# Patient Record
Sex: Male | Born: 1935
Health system: Southern US, Community
[De-identification: ages and names within clinical notes are randomized; demographics above are authoritative.]

## PROBLEM LIST (undated history)

## (undated) DIAGNOSIS — N189 Chronic kidney disease, unspecified: Secondary | ICD-10-CM

## (undated) DIAGNOSIS — M1990 Unspecified osteoarthritis, unspecified site: Secondary | ICD-10-CM

## (undated) DIAGNOSIS — E785 Hyperlipidemia, unspecified: Secondary | ICD-10-CM

## (undated) DIAGNOSIS — R0600 Dyspnea, unspecified: Secondary | ICD-10-CM

## (undated) DIAGNOSIS — K589 Irritable bowel syndrome without diarrhea: Secondary | ICD-10-CM

## (undated) DIAGNOSIS — K219 Gastro-esophageal reflux disease without esophagitis: Secondary | ICD-10-CM

## (undated) DIAGNOSIS — C4490 Unspecified malignant neoplasm of skin, unspecified: Secondary | ICD-10-CM

## (undated) DIAGNOSIS — J449 Chronic obstructive pulmonary disease, unspecified: Secondary | ICD-10-CM

## (undated) DIAGNOSIS — I10 Essential (primary) hypertension: Secondary | ICD-10-CM

## (undated) HISTORY — PX: CHOLECYSTECTOMY: SHX55

## (undated) HISTORY — DX: Irritable bowel syndrome, unspecified: K58.9

## (undated) HISTORY — PX: SKIN CANCER EXCISION: SHX779

## (undated) HISTORY — PX: HERNIA REPAIR: SHX51

## (undated) HISTORY — PX: APPENDECTOMY: SHX54

## (undated) HISTORY — DX: Essential (primary) hypertension: I10

## (undated) HISTORY — DX: Hyperlipidemia, unspecified: E78.5

---

## 1999-10-02 ENCOUNTER — Encounter (INDEPENDENT_AMBULATORY_CARE_PROVIDER_SITE_OTHER): Payer: Self-pay | Admitting: Specialist

## 1999-10-02 ENCOUNTER — Ambulatory Visit (HOSPITAL_COMMUNITY): Admission: RE | Admit: 1999-10-02 | Discharge: 1999-10-02 | Payer: Self-pay | Admitting: Gastroenterology

## 2005-09-20 ENCOUNTER — Encounter: Admission: RE | Admit: 2005-09-20 | Discharge: 2005-09-20 | Payer: Self-pay | Admitting: Urology

## 2005-09-26 ENCOUNTER — Encounter (INDEPENDENT_AMBULATORY_CARE_PROVIDER_SITE_OTHER): Payer: Self-pay | Admitting: *Deleted

## 2005-09-26 ENCOUNTER — Ambulatory Visit (HOSPITAL_BASED_OUTPATIENT_CLINIC_OR_DEPARTMENT_OTHER): Admission: RE | Admit: 2005-09-26 | Discharge: 2005-09-26 | Payer: Self-pay | Admitting: Urology

## 2010-05-19 ENCOUNTER — Encounter
Admission: RE | Admit: 2010-05-19 | Discharge: 2010-05-19 | Payer: Self-pay | Source: Home / Self Care | Attending: Family Medicine | Admitting: Family Medicine

## 2010-09-15 NOTE — Op Note (Signed)
NAME:  Gordon Soto, Gordon Soto NO.:  [id number]   MEDICAL RECORD NO.:  [id number]          PATIENT TYPE:  AMB   LOCATION:  [facility]                         FACILITY:  [facility]   PHYSICIAN:  Valetta Fuller, M.D.  DATE OF BIRTH:  26-Feb-1929   DATE OF PROCEDURE:  09/26/2005  DATE OF DISCHARGE:                                 OPERATIVE REPORT   PREOPERATIVE DIAGNOSIS:  Left spermatocele.   POSTOPERATIVE DIAGNOSIS:  Left cystic degeneration of epididymis.   PROCEDURE:  Left epididymectomy.   SURGEON:  Valetta Fuller, M.D.   ASSISTANT:  Terie Purser, M.D.   ANESTHESIA:  General endotracheal.   COMPLICATIONS:  None.   ESTIMATED BLOOD LOSS:  Minimal.   SPECIMENS:  Epididymis, left mass.   DISPOSITION:  Stable to postanesthesia care unit.   INDICATIONS FOR PROCEDURE:  Mr. Bissonette is a 77 year old gentleman who has  had persistent symptomatic left-sided scrotal mass.  This has slowly been  enlarging over the years.  It is a spermatocele.  The patient desired  surgical intervention for excision of the spermatocele.  He is consented for  the procedure after full explanation of benefits and risks.   DESCRIPTION OF PROCEDURE:  The patient was brought to the operating room and  properly identified.  Administered general anesthesia and given preoperative  antibiotics, placed in the supine position on the operating table, prepped  and draped in the usual sterile fashion.  Time out was performed to confirm  correct patient, procedure and side.  We then made a midline scrotal  incision through the raphae.  Dissection was carried down through the dartos  to expose the left testicle.  There was a large cystic appearing mass which  appeared to incorporate all the epididymis.  This was deemed to be a cystic  degeneration of the epididymis and not a spermatocele.  We then began to  carefully dissect the epididymis from the testicle.  Care was taken to avoid  injury to the testicle or the  cord structures adjacent to this.  Using a  combination of careful blunt dissection as well as Bovie electrocautery, we  were able to completely excise the mass from the testicle.  This was then  sent to pathology.  Hemostasis was obtained using the Bovie electrocautery,  was excellent after the mass was excised.  We then replaced the testicle in  the scrotum and the overlying dartos was closed in a running fashion using a  3-0 Vicryl suture.  The skin was then closed in a running fashion using a 4-  0 Vicryl.  Bacitracin and a sterile dressing were applied to the incision.  The patient was then awakened from anesthesia and transported to the  recovery room in stable condition.  There were no complications.   Please note that Dr. Isabel Caprice was present and participated in all aspects of  this procedure.  He was the primary Pensions consultant.     ______________________________  Terie Purser, MD      Valetta Fuller, M.D.  Electronically Signed    JH/MEDQ  D:  09/26/2005  T:  09/26/2005  Job:  [id number]

## 2010-09-15 NOTE — Procedures (Signed)
[facility]  Patient:    Gordon Soto, Gordon Soto                      MRN: [id number] Proc. Date: 10/02/99 Adm. Date:  [id number] Attending:  Nelda Marseille CC:         Petra Kuba, M.D.             Kizzie Furnish, M.D.                           Procedure Report  PROCEDURE:  Colonoscopy with biopsy.  INDICATIONS:  The patient with a family history of colon polyps due to colonic screening.  Some mild GI symptoms.  Consent was signed after risks, benefits, methods,, and options were thoroughly discussed multiple times in the past by Mr. Burstein and his wife.  MEDICINES USED:  Demerol 75, Versed 7.5.  PROCEDURE:  Rectal inspection is pertinent for external hemorrhoids.  Digital exam is negative.  Video colonoscope was inserted and there was some difficulty probably due to looping and adhesions from his previous surgery. We were finally able to advance to the cecum.  This did require rolling him on his back, on his right side and then back on left side.  Multiple abdominal pressures were obtained.  On insertion left-sided diverticula were seen but no other abnormalities.  The cecum was identified by the appendiceal orifice and the ileocecal valve.  The prep was fairly good.  He did have lots of liquid stool that required frequent washing and suctioning.  On slow withdrawal through the cecum the cecum was normal.  In the ascending colon a tiny 1 mm polyp was seen and was cold biopsied x 1.  No other polypoid lesions were seen as we slowly withdrew back to the rectum.  He did have significant left-sided diverticula.  Lots of washing and suctioning was done but no other abnormalities were seen.  Once back in the rectum the scope was retroflexed pertinent for some internal hemorrhoids with some tears probably due to scope trauma.  Scope was straightened, readvanced a short ways up the sigmoid, air was suctioned, scope removed.  The patient tolerated the  procedure adequately. There was no obvious or immediate complication.  ENDOSCOPIC DIAGNOSIS: 1. Internal and external hemorrhoids with tears probably due to scope trauma. 2. Significant left-sided diverticula. 3. Tiny ascending polyps status post cold biopsy. 4. ______ within normal limits to the cecum.  PLAN:  Yearly, rectals and guaiacs per Dr. Fayrene Fearing.  Would probably proceed with an upper GI, small bowel follow through if GI symptoms continue just to make sure no other abnormality.  Have to see back p.r.n. otherwise return to care of Dr. Fayrene Fearing and await pathology to determine repeat colonic screening. DD:  10/02/99 TD:  10/04/99 Job: [id number]

## 2011-08-20 ENCOUNTER — Encounter: Payer: Self-pay | Admitting: Vascular Surgery

## 2011-08-21 ENCOUNTER — Encounter: Payer: Self-pay | Admitting: Vascular Surgery

## 2011-08-21 ENCOUNTER — Ambulatory Visit (INDEPENDENT_AMBULATORY_CARE_PROVIDER_SITE_OTHER): Payer: Medicare Other | Admitting: Vascular Surgery

## 2011-08-21 ENCOUNTER — Ambulatory Visit (INDEPENDENT_AMBULATORY_CARE_PROVIDER_SITE_OTHER): Payer: Medicare Other | Admitting: *Deleted

## 2011-08-21 VITALS — BP 169/89 | HR 72 | Resp 20 | Ht 70.0 in | Wt 190.0 lb

## 2011-08-21 DIAGNOSIS — M25539 Pain in unspecified wrist: Secondary | ICD-10-CM

## 2011-08-21 DIAGNOSIS — M79609 Pain in unspecified limb: Secondary | ICD-10-CM

## 2011-08-21 NOTE — Progress Notes (Signed)
Subjective:     Patient ID: Gordon Soto, male   DOB: April 27, 1929, 79 y.o.   MRN: [id number]  HPI this 78 year old male patient was referred for a possible varicose vein in the left upper extremity which is painful. He has no history of DVT, thrombophlebitis, or blood clots in any extremities. He has noticed a painful area on the lateral aspect of his left forearm for the last 10 years or so which has not changed in size. There's been no history of ulceration or bleeding. In occasionally will be tender if someone bumps it but generally it is asymptomatic. He has no similar lesions in any other areas.  Past Medical History  Diagnosis Date  . Hypertension   . Hyperlipidemia   . Diabetes mellitus   . Irritable bowel syndrome   . Gastric ulcer     History  Substance Use Topics  . Smoking status: Former Smoker -- 15 years    Types: Cigarettes    Quit date: 08/21/1963  . Smokeless tobacco: Former Neurosurgeon    Quit date: 08/21/1963  . Alcohol Use: No    Family History  Problem Relation Age of Onset  . Diabetes Father     No Known Allergies  Current outpatient prescriptions:dexlansoprazole (DEXILANT) 60 MG capsule, Take 60 mg by mouth daily., Disp: , Rfl: ;  diazepam (VALIUM) 5 MG tablet, Take 5 mg by mouth as needed., Disp: , Rfl: ;  Dutasteride-Tamsulosin HCl (JALYN) 0.5-0.4 MG CAPS, Take 0.5 mg by mouth daily., Disp: , Rfl: ;  glipiZIDE (GLUCOTROL XL) 10 MG 24 hr tablet, Take 10 mg by mouth daily., Disp: , Rfl:  hyoscyamine (LEVBID) 0.375 MG 12 hr tablet, Take 0.375 mg by mouth every 12 (twelve) hours as needed., Disp: , Rfl: ;  lisinopril (PRINIVIL,ZESTRIL) 40 MG tablet, Take 40 mg by mouth daily., Disp: , Rfl: ;  metformin (FORTAMET) 500 MG (OSM) 24 hr tablet, Take 500 mg by mouth daily with breakfast., Disp: , Rfl: ;  metoprolol succinate (TOPROL-XL) 50 MG 24 hr tablet, Take 50 mg by mouth daily. Take with or immediately following a meal., Disp: , Rfl:  simvastatin (ZOCOR) 20 MG tablet,  Take 20 mg by mouth every evening., Disp: , Rfl: ;  VENTOLIN HFA 108 (90 BASE) MCG/ACT inhaler, , Disp: , Rfl:   BP 169/89  Pulse 72  Resp 20  Ht 5\' 10"  (1.778 m)  Wt 190 lb (86.183 kg)  BMI 27.26 kg/m2  Body mass index is 27.26 kg/(m^2).          Review of Systems denies chest pain, dyspnea on exertion, PND, orthopnea, chronic bronchitis, claudication, does have history of mild dyspnea on exertion but no wheezing. Has history of irritable bowel syndrome. Also complains of occasional dizziness and decreased sensation in lower extremities. All other systems are negative and complete review of systems    Objective:   Physical Exam blood pressure 169/89 heart rate 72 respirations 20 Gen.-alert and oriented x3 in no apparent distress HEENT normal for age Lungs no rhonchi or wheezing Cardiovascular regular rhythm no murmurs carotid pulses 3+ palpable no bruits audible Abdomen soft nontender no palpable masses Musculoskeletal free of  major deformities Skin clear -no rashes Neurologic normal Lower extremities 3+ femoral and dorsalis pedis pulses palpable bilaterally with no edema Upper extremities with 3+ brachial radial and ulnar pulses palpable. Left upper extremity has a small cystic structure located over the fibula on the lateral aspect of the arm about 6 cm proximal to  the wrist. It is not inflamed but is mildly tender to touch. It is about 0.5 cm in diameter. Does not appear to fill with blood. No audible overlying this.  Today I ordered a venous duplex exam of the left upper [place]. There is no superficial venous thrombosis or DVT. The structure which is noted above measures 0.4 x 0.7 cm and has no arterial or venous flow. Does not appear to be a varicosity.    Assessment:     Painful cystic structure left forearm-not vascular in origin    Plan:     No evidence of venous insufficiency or arterial problems and left upper extremity Do not think cystic structure needs  treatment-has been there for multiple years without change-not a vascular structure

## 2011-08-28 NOTE — Procedures (Unsigned)
DUPLEX DEEP VENOUS EXAM - UPPER EXTREMITY  INDICATION:  Pain in left arm  HISTORY:  Edema:  No Trauma/Surgery:  No Pain:  Yes PE:  No Previous DVT:  No Anticoagulants: Other:  DUPLEX EXAM:                                            Bas/               IJV   SCV     AXV    BrachV  Ceph V               R  L  R   L   R  L   R   L   R  L Thrombosis                             o      o Spontaneous                            +      + Phasic                                 +      + Augmentation                           +      + Compressible                           +      + Competent Legend:  + - yes  o - no  p - partial  D - decreased  IMPRESSION: 1. No evidence of deep venous thrombosis or superficial venous     thrombus in the left upper extremity. 2. The focal area of tenderness reveals a cystic structure of mixed     echogenicity measuring 0.42 cm x 0.73 cm.  Structure appears     nonvascular.  However, there are arterial and venous flow in the     area.  ___________________________________________ Quita Skye. Hart Rochester, M.D.  LT/MEDQ  D:  08/21/2011  T:  08/21/2011  Job:  [id number]

## 2011-11-15 ENCOUNTER — Other Ambulatory Visit: Payer: Self-pay | Admitting: Dermatology

## 2012-04-10 ENCOUNTER — Other Ambulatory Visit: Payer: Self-pay

## 2012-04-10 DIAGNOSIS — R0602 Shortness of breath: Secondary | ICD-10-CM

## 2012-04-11 ENCOUNTER — Ambulatory Visit (HOSPITAL_COMMUNITY)
Admission: RE | Admit: 2012-04-11 | Discharge: 2012-04-11 | Disposition: A | Discharge status: Home / Self Care | Payer: Medicare Other | Source: Ambulatory Visit | Attending: Physician Assistant | Admitting: Physician Assistant

## 2012-04-11 DIAGNOSIS — R0602 Shortness of breath: Secondary | ICD-10-CM | POA: Insufficient documentation

## 2012-04-11 MED ORDER — ALBUTEROL SULFATE (5 MG/ML) 0.5% IN NEBU
2.5000 mg | INHALATION_SOLUTION | Freq: Once | RESPIRATORY_TRACT | Status: AC
  Administered 2012-04-11: 2.5 mg via RESPIRATORY_TRACT

## 2016-05-15 DIAGNOSIS — E7800 Pure hypercholesterolemia, unspecified: Secondary | ICD-10-CM | POA: Diagnosis not present

## 2016-05-15 DIAGNOSIS — K259 Gastric ulcer, unspecified as acute or chronic, without hemorrhage or perforation: Secondary | ICD-10-CM | POA: Diagnosis not present

## 2016-05-15 DIAGNOSIS — I10 Essential (primary) hypertension: Secondary | ICD-10-CM | POA: Diagnosis not present

## 2016-05-15 DIAGNOSIS — Z6827 Body mass index (BMI) 27.0-27.9, adult: Secondary | ICD-10-CM | POA: Diagnosis not present

## 2016-05-15 DIAGNOSIS — N400 Enlarged prostate without lower urinary tract symptoms: Secondary | ICD-10-CM | POA: Diagnosis not present

## 2016-05-15 DIAGNOSIS — E119 Type 2 diabetes mellitus without complications: Secondary | ICD-10-CM | POA: Diagnosis not present

## 2016-06-11 DIAGNOSIS — M1990 Unspecified osteoarthritis, unspecified site: Secondary | ICD-10-CM | POA: Diagnosis not present

## 2016-07-08 DIAGNOSIS — R0600 Dyspnea, unspecified: Secondary | ICD-10-CM | POA: Diagnosis not present

## 2016-07-08 DIAGNOSIS — J069 Acute upper respiratory infection, unspecified: Secondary | ICD-10-CM | POA: Diagnosis not present

## 2016-07-11 DIAGNOSIS — Z6826 Body mass index (BMI) 26.0-26.9, adult: Secondary | ICD-10-CM | POA: Diagnosis not present

## 2016-07-11 DIAGNOSIS — E1140 Type 2 diabetes mellitus with diabetic neuropathy, unspecified: Secondary | ICD-10-CM | POA: Diagnosis not present

## 2016-08-26 DIAGNOSIS — J449 Chronic obstructive pulmonary disease, unspecified: Secondary | ICD-10-CM | POA: Diagnosis not present

## 2016-08-26 DIAGNOSIS — R0602 Shortness of breath: Secondary | ICD-10-CM | POA: Diagnosis not present

## 2016-09-18 DIAGNOSIS — E1140 Type 2 diabetes mellitus with diabetic neuropathy, unspecified: Secondary | ICD-10-CM | POA: Diagnosis not present

## 2016-09-18 DIAGNOSIS — J449 Chronic obstructive pulmonary disease, unspecified: Secondary | ICD-10-CM | POA: Diagnosis not present

## 2016-09-18 DIAGNOSIS — N183 Chronic kidney disease, stage 3 (moderate): Secondary | ICD-10-CM | POA: Diagnosis not present

## 2016-09-18 DIAGNOSIS — E7800 Pure hypercholesterolemia, unspecified: Secondary | ICD-10-CM | POA: Diagnosis not present

## 2016-09-18 DIAGNOSIS — I10 Essential (primary) hypertension: Secondary | ICD-10-CM | POA: Diagnosis not present

## 2016-09-18 DIAGNOSIS — Z6826 Body mass index (BMI) 26.0-26.9, adult: Secondary | ICD-10-CM | POA: Diagnosis not present

## 2016-11-08 DIAGNOSIS — R5383 Other fatigue: Secondary | ICD-10-CM | POA: Diagnosis not present

## 2016-11-08 DIAGNOSIS — R300 Dysuria: Secondary | ICD-10-CM | POA: Diagnosis not present

## 2016-11-08 DIAGNOSIS — Z13220 Encounter for screening for lipoid disorders: Secondary | ICD-10-CM | POA: Diagnosis not present

## 2016-11-08 DIAGNOSIS — E109 Type 1 diabetes mellitus without complications: Secondary | ICD-10-CM | POA: Diagnosis not present

## 2016-11-08 DIAGNOSIS — Z125 Encounter for screening for malignant neoplasm of prostate: Secondary | ICD-10-CM | POA: Diagnosis not present

## 2016-11-08 DIAGNOSIS — I10 Essential (primary) hypertension: Secondary | ICD-10-CM | POA: Diagnosis not present

## 2016-11-08 DIAGNOSIS — J449 Chronic obstructive pulmonary disease, unspecified: Secondary | ICD-10-CM | POA: Diagnosis not present

## 2016-11-17 DIAGNOSIS — R1312 Dysphagia, oropharyngeal phase: Secondary | ICD-10-CM | POA: Diagnosis not present

## 2016-11-19 ENCOUNTER — Other Ambulatory Visit: Payer: Self-pay | Admitting: Physician Assistant

## 2016-11-19 DIAGNOSIS — R1310 Dysphagia, unspecified: Secondary | ICD-10-CM

## 2016-11-20 ENCOUNTER — Ambulatory Visit
Admission: RE | Admit: 2016-11-20 | Discharge: 2016-11-20 | Disposition: A | Discharge status: Home / Self Care | Payer: PPO | Source: Ambulatory Visit | Attending: Physician Assistant | Admitting: Physician Assistant

## 2016-11-20 DIAGNOSIS — R1310 Dysphagia, unspecified: Secondary | ICD-10-CM | POA: Diagnosis not present

## 2016-11-26 DIAGNOSIS — L905 Scar conditions and fibrosis of skin: Secondary | ICD-10-CM | POA: Diagnosis not present

## 2016-11-26 DIAGNOSIS — L570 Actinic keratosis: Secondary | ICD-10-CM | POA: Diagnosis not present

## 2016-11-26 DIAGNOSIS — Z85828 Personal history of other malignant neoplasm of skin: Secondary | ICD-10-CM | POA: Diagnosis not present

## 2016-11-28 DIAGNOSIS — Q399 Congenital malformation of esophagus, unspecified: Secondary | ICD-10-CM | POA: Diagnosis not present

## 2016-11-28 DIAGNOSIS — R933 Abnormal findings on diagnostic imaging of other parts of digestive tract: Secondary | ICD-10-CM | POA: Diagnosis not present

## 2016-11-28 DIAGNOSIS — R1310 Dysphagia, unspecified: Secondary | ICD-10-CM | POA: Diagnosis not present

## 2016-11-28 DIAGNOSIS — K224 Dyskinesia of esophagus: Secondary | ICD-10-CM | POA: Diagnosis not present

## 2017-01-10 DIAGNOSIS — R1310 Dysphagia, unspecified: Secondary | ICD-10-CM | POA: Diagnosis not present

## 2017-01-25 DIAGNOSIS — M19071 Primary osteoarthritis, right ankle and foot: Secondary | ICD-10-CM | POA: Diagnosis not present

## 2017-01-25 DIAGNOSIS — E1142 Type 2 diabetes mellitus with diabetic polyneuropathy: Secondary | ICD-10-CM | POA: Diagnosis not present

## 2017-01-25 DIAGNOSIS — G8929 Other chronic pain: Secondary | ICD-10-CM | POA: Diagnosis not present

## 2017-01-25 DIAGNOSIS — E1165 Type 2 diabetes mellitus with hyperglycemia: Secondary | ICD-10-CM | POA: Diagnosis not present

## 2017-02-11 DIAGNOSIS — E109 Type 1 diabetes mellitus without complications: Secondary | ICD-10-CM | POA: Diagnosis not present

## 2017-02-11 DIAGNOSIS — J449 Chronic obstructive pulmonary disease, unspecified: Secondary | ICD-10-CM | POA: Diagnosis not present

## 2017-02-11 DIAGNOSIS — I10 Essential (primary) hypertension: Secondary | ICD-10-CM | POA: Diagnosis not present

## 2017-02-11 DIAGNOSIS — Z23 Encounter for immunization: Secondary | ICD-10-CM | POA: Diagnosis not present

## 2017-02-11 DIAGNOSIS — E1142 Type 2 diabetes mellitus with diabetic polyneuropathy: Secondary | ICD-10-CM | POA: Diagnosis not present

## 2017-02-11 DIAGNOSIS — M25571 Pain in right ankle and joints of right foot: Secondary | ICD-10-CM | POA: Diagnosis not present

## 2017-02-18 ENCOUNTER — Ambulatory Visit (INDEPENDENT_AMBULATORY_CARE_PROVIDER_SITE_OTHER): Payer: PPO

## 2017-02-18 ENCOUNTER — Ambulatory Visit (INDEPENDENT_AMBULATORY_CARE_PROVIDER_SITE_OTHER): Payer: PPO | Admitting: Podiatry

## 2017-02-18 ENCOUNTER — Other Ambulatory Visit: Payer: Self-pay | Admitting: Podiatry

## 2017-02-18 DIAGNOSIS — M7751 Other enthesopathy of right foot: Secondary | ICD-10-CM

## 2017-02-18 DIAGNOSIS — R52 Pain, unspecified: Secondary | ICD-10-CM

## 2017-02-18 NOTE — Progress Notes (Signed)
   Subjective:    Patient ID: Gordon Soto, male    DOB: [birth year], 83 y.o.   MRN: [id number]  HPI This patient presents today with approximately 1 month history of a painful medial right ankle aggravated with standing walking with directions of symptoms with rest and elevation. He describes some burning, sharp painful sensations in his medial right ankle. He even describes some nighttime pain. He describes visiting a doctor approximately 2 weeks ago and was given a shot into his right foot with minimal reduction symptoms. He describes the visit as a diagnosis of arthritis. He recalls a previous episode some several 3 years ago where he also had a similar problem and had an injection into his right foot ankle area which said he resolved his symptoms until present. He said he said a recent foot x-ray, however, does not have copies of x-ray present with him today Patient is a diabetic he denies any history of claudication, however complains of burning and numbness Patient describes quitting smoking in 1960 Patient's wife and daughter present to treatment room  Review of Systems  All other systems reviewed and are negative.      Objective:   Physical Exam  Orientated 3  Vascular: Peripheral pitting edema bilaterally DP and PT pulses 2/4 bilaterally Reflex within normal bilaterally  Neurological: Sensation to 10 g monofilament wire intact 7/8 bilaterally Vibratory sensation nonreactive bilaterally Ankle reflexes reactive bilaterally  Dermatological: Atrophic skin with absent hair growth bilaterally No open skin lesions bilaterally  Musculoskeletal: Palpable tenderness medial right ankle which duplicates patient's discomfort Negative Tinel's sign when the tarsal tunnel is percussed Patient is able to weakly heel off on the right and left Manual motor testing dorsi flexion, plantar flexion, inversion, eversion 5/5 bilaterally  X-ray examination weightbearing right ankle dated  02/18/2017 Intact bony structure without any fracture and/or dislocation Ankle mortise appears within normal limits No increased soft tissue density noted Radiographic impression: No acute bony abnormality noted weightbearing x-ray of the right ankle dated 02/18/2017         Assessment & Plan:   Assessment: Diabetic with satisfactory neurovascular status Medial right ankle tendinopathy  Plan: Patient referred to [facility] for noncontrast MRI the right ankle for the indication of pain around the medial right ankle  Cam Walker boot dispensed to wear and right foot/ankle when standing and walking. Instructed patient not to wear boot when driving, showering and sleeping  Notify patient upon receipt of MRI report

## 2017-02-18 NOTE — Patient Instructions (Signed)
Today your diabetic foot screen was satisfactory Ankle x-ray did not demonstrate any acute problems Please wear the boot on your right leg/foot when standing walking. Do not wear boot when driving showering or sleeping Referring you to an imaging center for a MRI the right ankle  Diabetes and Foot Care Diabetes may cause you to have problems because of poor blood supply (circulation) to your feet and legs. This may cause the skin on your feet to become thinner, break easier, and heal more slowly. Your skin may become dry, and the skin may peel and crack. You may also have nerve damage in your legs and feet causing decreased feeling in them. You may not notice minor injuries to your feet that could lead to infections or more serious problems. Taking care of your feet is one of the most important things you can do for yourself. Follow these instructions at home:  Wear shoes at all times, even in the house. Do not go barefoot. Bare feet are easily injured.  Check your feet daily for blisters, cuts, and redness. If you cannot see the bottom of your feet, use a mirror or ask someone for help.  Wash your feet with warm water (do not use hot water) and mild soap. Then pat your feet and the areas between your toes until they are completely dry. Do not soak your feet as this can dry your skin.  Apply a moisturizing lotion or petroleum jelly (that does not contain alcohol and is unscented) to the skin on your feet and to dry, brittle toenails. Do not apply lotion between your toes.  Trim your toenails straight across. Do not dig under them or around the cuticle. File the edges of your nails with an emery board or nail file.  Do not cut corns or calluses or try to remove them with medicine.  Wear clean socks or stockings every day. Make sure they are not too tight. Do not wear knee-high stockings since they may decrease blood flow to your legs.  Wear shoes that fit properly and have enough cushioning.  To break in new shoes, wear them for just a few hours a day. This prevents you from injuring your feet. Always look in your shoes before you put them on to be sure there are no objects inside.  Do not cross your legs. This may decrease the blood flow to your feet.  If you find a minor scrape, cut, or break in the skin on your feet, keep it and the skin around it clean and dry. These areas may be cleansed with mild soap and water. Do not cleanse the area with peroxide, alcohol, or iodine.  When you remove an adhesive bandage, be sure not to damage the skin around it.  If you have a wound, look at it several times a day to make sure it is healing.  Do not use heating pads or hot water bottles. They may burn your skin. If you have lost feeling in your feet or legs, you may not know it is happening until it is too late.  Make sure your health care provider performs a complete foot exam at least annually or more often if you have foot problems. Report any cuts, sores, or bruises to your health care provider immediately. Contact a health care provider if:  You have an injury that is not healing.  You have cuts or breaks in the skin.  You have an ingrown nail.  You notice redness on  your legs or feet.  You feel burning or tingling in your legs or feet.  You have pain or cramps in your legs and feet.  Your legs or feet are numb.  Your feet always feel cold. Get help right away if:  There is increasing redness, swelling, or pain in or around a wound.  There is a red line that goes up your leg.  Pus is coming from a wound.  You develop a fever or as directed by your health care provider.  You notice a bad smell coming from an ulcer or wound. This information is not intended to replace advice given to you by your health care provider. Make sure you discuss any questions you have with your health care provider. Document Released: 04/13/2000 Document Revised: 09/22/2015 Document  Reviewed: 09/23/2012 Elsevier Interactive Patient Education  2017 [employer].

## 2017-02-19 ENCOUNTER — Encounter: Payer: Self-pay | Admitting: Podiatry

## 2017-02-19 NOTE — Addendum Note (Signed)
Addended by: Harriett Sine D on: 02/19/2017 08:38 AM   Modules accepted: Orders

## 2017-02-20 ENCOUNTER — Telehealth: Payer: Self-pay | Admitting: *Deleted

## 2017-02-20 NOTE — Telephone Encounter (Signed)
Faxed orders to [facility]. Faxed required form, Clinicals, and demographics to [employer].

## 2017-02-25 DIAGNOSIS — J019 Acute sinusitis, unspecified: Secondary | ICD-10-CM | POA: Diagnosis not present

## 2017-02-25 DIAGNOSIS — R05 Cough: Secondary | ICD-10-CM | POA: Diagnosis not present

## 2017-02-26 ENCOUNTER — Ambulatory Visit
Admission: RE | Admit: 2017-02-26 | Discharge: 2017-02-26 | Disposition: A | Discharge status: Home / Self Care | Payer: PPO | Source: Ambulatory Visit | Attending: Podiatry | Admitting: Podiatry

## 2017-02-26 DIAGNOSIS — R600 Localized edema: Secondary | ICD-10-CM | POA: Diagnosis not present

## 2017-03-04 ENCOUNTER — Ambulatory Visit: Payer: PPO | Admitting: Podiatry

## 2017-03-04 ENCOUNTER — Encounter: Payer: Self-pay | Admitting: Podiatry

## 2017-03-04 DIAGNOSIS — M659 Synovitis and tenosynovitis, unspecified: Secondary | ICD-10-CM | POA: Diagnosis not present

## 2017-03-04 DIAGNOSIS — M25571 Pain in right ankle and joints of right foot: Secondary | ICD-10-CM

## 2017-03-04 NOTE — Telephone Encounter (Deleted)
-----   Message from Gean Birchwood, DPM sent at 03/04/2017  7:50 AM EST ----- Results of MRI image dated 02/26/2017 Contact patient and schedule a consult to discuss MRI results and evaluate patient's progress Kendell Bane DPM 03/04/2017  IMPRESSION: 1. Mild posterior tibialis, flexor digitorum longus, and peroneal tenosynovitis. No tendon tear. 2. Thickening of the plantar fascia, consistent with prior plantar fasciitis. 3. Type II os navicular without evidence of degenerative changes. 4. Trace tibiotalar and posterior subtalar joint effusions. 5. Small amount of edema in the sinus tarsi. Correlate for sinus tarsi syndrome.

## 2017-03-04 NOTE — Patient Instructions (Signed)
The MRI report demonstrated inflammation of tenderness on the inside and outside of the right ankle. Also it demonstrated some inflammation within the sinus tarsi which was injected with cortisone today. Cortisone injections may temporarily increase your blood glucose levels levels Take gabapentin 100 mg one hour prior to bedtime Please wear the boot on your right leg and ankle when walking and standing. Remove the boot when driving and sleeping

## 2017-03-05 ENCOUNTER — Encounter: Payer: Self-pay | Admitting: Podiatry

## 2017-03-05 NOTE — Telephone Encounter (Signed)
-----   Message from Gean Birchwood, DPM sent at 03/04/2017  7:50 AM EST ----- Results of MRI image dated 02/26/2017 Contact patient and schedule a consult to discuss MRI results and evaluate patient's progress Kendell Bane DPM 03/04/2017  IMPRESSION: 1. Mild posterior tibialis, flexor digitorum longus, and peroneal tenosynovitis. No tendon tear. 2. Thickening of the plantar fascia, consistent with prior plantar fasciitis. 3. Type II os navicular without evidence of degenerative changes. 4. Trace tibiotalar and posterior subtalar joint effusions. 5. Small amount of edema in the sinus tarsi. Correlate for sinus tarsi syndrome.

## 2017-03-05 NOTE — Progress Notes (Signed)
Patient ID: Gordon Soto, male   DOB: [birth year], 83 y.o.   MRN: [id number]   Subjective: Patient presents for follow-up to review results of MRI image dated 02/18/2017 as well as to assess patient's progress from the individual visit of 02/18/2017. On the initial visit a Cam Walker boot was dispensed for patient to wear on ongoing regular basis except when showering, sleeping and driving. Patient admits that he did not wear boot at all and relates persistent of his pain on the medial right ankle. He describes some occasional burning and discomfort primarily with weightbearing with leads with rest He is a known diabetic with some history of neuropathy and a history of intermittent use of gabapentin 100-300 milligrams which she takes intermittently. He describes lethargic response when taking 300 and is able to take 100 mg of gabapentin with minimal symptoms and reduction the symptoms Patient daughter present to treatment room  Objective:  Orientated 3  Vascular: Peripheral pitting edema bilaterally DP and PT pulses 2/4 bilaterally Reflex within normal bilaterally  Neurological: Sensation to 10 g monofilament wire intact 7/8 bilaterally Vibratory sensation nonreactive bilaterally Ankle reflexes reactive bilaterally  Dermatological: Atrophic skin with absent hair growth bilaterally No open skin lesions bilaterally  Musculoskeletal: Palpable tenderness medial right ankle which duplicates patient's discomfort Negative Tinel's sign when the tarsal tunnel is percussed Patient is able to weakly heel off on the right and left Manual motor testing dorsi flexion, plantar flexion, inversion, eversion 5/5 bilaterally Palpable tenderness right sinus tarsi  X-ray examination weightbearing right ankle dated 02/18/2017 Intact bony structure without any fracture and/or dislocation Ankle mortise appears within normal limits No increased soft tissue density noted Radiographic impression: No  acute bony abnormality noted weightbearing x-ray of the right ankle dated 02/18/2017  MRI of the right ankle without contrast dated 02/26/2017   IMPRESSION: 1. Mild posterior tibialis, flexor digitorum longus, and peroneal tenosynovitis. No tendon tear. 2. Thickening of the plantar fascia, consistent with prior plantar fasciitis. 3. Type II os navicular without evidence of degenerative changes. 4. Trace tibiotalar and posterior subtalar joint effusions. 5. Small amount of edema in the sinus tarsi. Correlate for sinus tarsi syndrome.  Assessment: MRI diagnosed mild posterior tibial, flexor digitorum longus and peroneal T no synovitis right Sinus tarsi syndrome right Diabetic peripheral neuropathy  Plan: Today I reviewed the results with patient today and instructed him to wear the Cam Walker boot an ongoing continuous basis except when driving, showering and sleeping 6 weeks I instructed him to take gabapentin 100 mg at bedtime Offered him steroid injection into the sinus tarsi right and he verbally consents Skin is prepped with alcohol and Betadine and 10 mg of dexamethasone phosphate and 10 mg of plain Marcaine were injected and sinus tarsi right. Patient tolerated procedure without any difficulty  Reappoint 6 weeks

## 2017-03-05 NOTE — Telephone Encounter (Signed)
I spoke with Dr. Amalia Hailey, pt was seen yesterday for an appt and MRI and treatment was discussed.

## 2017-03-29 DIAGNOSIS — S79911A Unspecified injury of right hip, initial encounter: Secondary | ICD-10-CM | POA: Diagnosis not present

## 2017-03-29 DIAGNOSIS — E119 Type 2 diabetes mellitus without complications: Secondary | ICD-10-CM | POA: Diagnosis not present

## 2017-03-29 DIAGNOSIS — I509 Heart failure, unspecified: Secondary | ICD-10-CM | POA: Diagnosis not present

## 2017-03-29 DIAGNOSIS — M25551 Pain in right hip: Secondary | ICD-10-CM | POA: Diagnosis not present

## 2017-03-29 DIAGNOSIS — I110 Hypertensive heart disease with heart failure: Secondary | ICD-10-CM | POA: Diagnosis not present

## 2017-03-29 DIAGNOSIS — J449 Chronic obstructive pulmonary disease, unspecified: Secondary | ICD-10-CM | POA: Diagnosis not present

## 2017-03-29 DIAGNOSIS — Z9049 Acquired absence of other specified parts of digestive tract: Secondary | ICD-10-CM | POA: Diagnosis not present

## 2017-03-29 DIAGNOSIS — I252 Old myocardial infarction: Secondary | ICD-10-CM | POA: Diagnosis not present

## 2017-03-29 DIAGNOSIS — Z87891 Personal history of nicotine dependence: Secondary | ICD-10-CM | POA: Diagnosis not present

## 2017-03-29 DIAGNOSIS — I2510 Atherosclerotic heart disease of native coronary artery without angina pectoris: Secondary | ICD-10-CM | POA: Diagnosis not present

## 2017-04-03 DIAGNOSIS — M25551 Pain in right hip: Secondary | ICD-10-CM | POA: Diagnosis not present

## 2017-04-09 DIAGNOSIS — Z7982 Long term (current) use of aspirin: Secondary | ICD-10-CM | POA: Diagnosis not present

## 2017-04-09 DIAGNOSIS — J449 Chronic obstructive pulmonary disease, unspecified: Secondary | ICD-10-CM | POA: Diagnosis not present

## 2017-04-09 DIAGNOSIS — E119 Type 2 diabetes mellitus without complications: Secondary | ICD-10-CM | POA: Diagnosis not present

## 2017-04-09 DIAGNOSIS — M25551 Pain in right hip: Secondary | ICD-10-CM | POA: Diagnosis not present

## 2017-04-09 DIAGNOSIS — Z7984 Long term (current) use of oral hypoglycemic drugs: Secondary | ICD-10-CM | POA: Diagnosis not present

## 2017-04-09 DIAGNOSIS — R531 Weakness: Secondary | ICD-10-CM | POA: Diagnosis not present

## 2017-04-09 DIAGNOSIS — Z7951 Long term (current) use of inhaled steroids: Secondary | ICD-10-CM | POA: Diagnosis not present

## 2017-04-09 DIAGNOSIS — E785 Hyperlipidemia, unspecified: Secondary | ICD-10-CM | POA: Diagnosis not present

## 2017-04-09 DIAGNOSIS — R296 Repeated falls: Secondary | ICD-10-CM | POA: Diagnosis not present

## 2017-04-09 DIAGNOSIS — R32 Unspecified urinary incontinence: Secondary | ICD-10-CM | POA: Diagnosis not present

## 2017-04-09 DIAGNOSIS — Z9181 History of falling: Secondary | ICD-10-CM | POA: Diagnosis not present

## 2017-04-10 DIAGNOSIS — R269 Unspecified abnormalities of gait and mobility: Secondary | ICD-10-CM | POA: Diagnosis not present

## 2017-04-10 DIAGNOSIS — M25551 Pain in right hip: Secondary | ICD-10-CM | POA: Diagnosis not present

## 2017-04-16 ENCOUNTER — Ambulatory Visit: Payer: PPO | Admitting: Podiatry

## 2017-05-02 DIAGNOSIS — R32 Unspecified urinary incontinence: Secondary | ICD-10-CM | POA: Diagnosis not present

## 2017-05-02 DIAGNOSIS — M25551 Pain in right hip: Secondary | ICD-10-CM | POA: Diagnosis not present

## 2017-05-02 DIAGNOSIS — E785 Hyperlipidemia, unspecified: Secondary | ICD-10-CM | POA: Diagnosis not present

## 2017-05-02 DIAGNOSIS — R531 Weakness: Secondary | ICD-10-CM | POA: Diagnosis not present

## 2017-05-02 DIAGNOSIS — E119 Type 2 diabetes mellitus without complications: Secondary | ICD-10-CM | POA: Diagnosis not present

## 2017-05-02 DIAGNOSIS — Z9181 History of falling: Secondary | ICD-10-CM | POA: Diagnosis not present

## 2017-05-02 DIAGNOSIS — Z7951 Long term (current) use of inhaled steroids: Secondary | ICD-10-CM | POA: Diagnosis not present

## 2017-05-02 DIAGNOSIS — J449 Chronic obstructive pulmonary disease, unspecified: Secondary | ICD-10-CM | POA: Diagnosis not present

## 2017-05-02 DIAGNOSIS — Z7982 Long term (current) use of aspirin: Secondary | ICD-10-CM | POA: Diagnosis not present

## 2017-05-02 DIAGNOSIS — Z7984 Long term (current) use of oral hypoglycemic drugs: Secondary | ICD-10-CM | POA: Diagnosis not present

## 2017-05-02 DIAGNOSIS — R296 Repeated falls: Secondary | ICD-10-CM | POA: Diagnosis not present

## 2017-05-16 DIAGNOSIS — R5383 Other fatigue: Secondary | ICD-10-CM | POA: Diagnosis not present

## 2017-05-16 DIAGNOSIS — F329 Major depressive disorder, single episode, unspecified: Secondary | ICD-10-CM | POA: Diagnosis not present

## 2017-05-16 DIAGNOSIS — E1142 Type 2 diabetes mellitus with diabetic polyneuropathy: Secondary | ICD-10-CM | POA: Diagnosis not present

## 2017-05-16 DIAGNOSIS — Z79899 Other long term (current) drug therapy: Secondary | ICD-10-CM | POA: Diagnosis not present

## 2017-05-16 DIAGNOSIS — J449 Chronic obstructive pulmonary disease, unspecified: Secondary | ICD-10-CM | POA: Diagnosis not present

## 2017-05-16 DIAGNOSIS — H35033 Hypertensive retinopathy, bilateral: Secondary | ICD-10-CM | POA: Diagnosis not present

## 2017-05-16 DIAGNOSIS — E1165 Type 2 diabetes mellitus with hyperglycemia: Secondary | ICD-10-CM | POA: Diagnosis not present

## 2017-05-16 DIAGNOSIS — E119 Type 2 diabetes mellitus without complications: Secondary | ICD-10-CM | POA: Diagnosis not present

## 2017-05-16 DIAGNOSIS — Z961 Presence of intraocular lens: Secondary | ICD-10-CM | POA: Diagnosis not present

## 2017-05-16 DIAGNOSIS — E113292 Type 2 diabetes mellitus with mild nonproliferative diabetic retinopathy without macular edema, left eye: Secondary | ICD-10-CM | POA: Diagnosis not present

## 2017-05-17 DIAGNOSIS — R002 Palpitations: Secondary | ICD-10-CM | POA: Diagnosis not present

## 2017-05-20 ENCOUNTER — Ambulatory Visit: Payer: PPO | Admitting: Podiatry

## 2017-05-20 ENCOUNTER — Encounter: Payer: Self-pay | Admitting: Podiatry

## 2017-05-20 DIAGNOSIS — M25571 Pain in right ankle and joints of right foot: Secondary | ICD-10-CM | POA: Diagnosis not present

## 2017-05-20 NOTE — Progress Notes (Signed)
This 11-year-ol male presents the office for continued evaluation and treatment of right ankle pain.  He had an MRI performed on 02/18/2017.  There does appear to be mild swelling noted along the tendons  and in the sinus tarsi right ankle.  No evidence of tendon tears.   He has previously been treated with a Cam walker and injection therapy right ankle.  This is my first visit with this patient and I found him to be a confused historian.  He presents to the office with his daughter.  He says that his right ankle has pain that comes .  He says that his foot is 60% better from the previous injection into the sinus tarsi.  He presents the office today for continued evaluation of his right ankle.   General Appearance  Alert, conversant and confused.  Vascular  Dorsalis pedis and posterior pulses are palpable  bilaterally.  Capillary return is within normal limits  bilaterally. Temperature is within normal limits  Bilaterally.  Neurologic  Senn-Weinstein monofilament wire test within normal limits  bilaterally. Muscle power within normal limits bilaterally.  Nails Normal nails with no evidence of bacterial or fungal infection.  Orthopedic  No limitations of motion of motion feet bilaterally.  No crepitus or effusions noted.  No evidence of pain or swelling along the medial and lateral tendons right ankle.  Mild palpable pain sinus tarsi right foot/ankle.  Skin  normotropic skin with no porokeratosis noted bilaterally.  No signs of infections or ulcers noted.   Sinus Tarsitis right ankle/foot.  ROV  Patient states that he is usually better but does have occasional pain.  We discussed with this problem and decided to treat him with injection therapy and powersteps.  Injection therapy using 1.0 cc. Of 2% xylocaine( 20 mg.) plus 1 cc. of kenalog-la ( 10 mg) plus 1/2 cc. of dexamethazone phosphate ( 2 mg).  He is to return to the office if the problem persists.   Gardiner Barefoot DPM

## 2017-05-29 DIAGNOSIS — Z85828 Personal history of other malignant neoplasm of skin: Secondary | ICD-10-CM | POA: Diagnosis not present

## 2017-05-29 DIAGNOSIS — L905 Scar conditions and fibrosis of skin: Secondary | ICD-10-CM | POA: Diagnosis not present

## 2017-05-29 DIAGNOSIS — L570 Actinic keratosis: Secondary | ICD-10-CM | POA: Diagnosis not present

## 2017-06-05 DIAGNOSIS — I2510 Atherosclerotic heart disease of native coronary artery without angina pectoris: Secondary | ICD-10-CM | POA: Diagnosis not present

## 2017-06-05 DIAGNOSIS — Z87891 Personal history of nicotine dependence: Secondary | ICD-10-CM | POA: Diagnosis not present

## 2017-06-05 DIAGNOSIS — J449 Chronic obstructive pulmonary disease, unspecified: Secondary | ICD-10-CM | POA: Diagnosis not present

## 2017-06-05 DIAGNOSIS — Z7984 Long term (current) use of oral hypoglycemic drugs: Secondary | ICD-10-CM | POA: Diagnosis not present

## 2017-06-05 DIAGNOSIS — R4182 Altered mental status, unspecified: Secondary | ICD-10-CM | POA: Diagnosis not present

## 2017-06-05 DIAGNOSIS — R51 Headache: Secondary | ICD-10-CM | POA: Diagnosis not present

## 2017-06-05 DIAGNOSIS — R410 Disorientation, unspecified: Secondary | ICD-10-CM | POA: Diagnosis not present

## 2017-06-05 DIAGNOSIS — I509 Heart failure, unspecified: Secondary | ICD-10-CM | POA: Diagnosis not present

## 2017-06-05 DIAGNOSIS — Z7982 Long term (current) use of aspirin: Secondary | ICD-10-CM | POA: Diagnosis not present

## 2017-06-05 DIAGNOSIS — I252 Old myocardial infarction: Secondary | ICD-10-CM | POA: Diagnosis not present

## 2017-06-05 DIAGNOSIS — Z79899 Other long term (current) drug therapy: Secondary | ICD-10-CM | POA: Diagnosis not present

## 2017-06-05 DIAGNOSIS — E119 Type 2 diabetes mellitus without complications: Secondary | ICD-10-CM | POA: Diagnosis not present

## 2017-06-05 DIAGNOSIS — I110 Hypertensive heart disease with heart failure: Secondary | ICD-10-CM | POA: Diagnosis not present

## 2017-06-05 DIAGNOSIS — S0990XA Unspecified injury of head, initial encounter: Secondary | ICD-10-CM | POA: Diagnosis not present

## 2017-06-05 DIAGNOSIS — R05 Cough: Secondary | ICD-10-CM | POA: Diagnosis not present

## 2017-06-07 DIAGNOSIS — M79602 Pain in left arm: Secondary | ICD-10-CM | POA: Diagnosis not present

## 2017-06-07 DIAGNOSIS — R410 Disorientation, unspecified: Secondary | ICD-10-CM | POA: Diagnosis not present

## 2017-06-07 DIAGNOSIS — W19XXXA Unspecified fall, initial encounter: Secondary | ICD-10-CM | POA: Diagnosis not present

## 2017-06-07 DIAGNOSIS — R05 Cough: Secondary | ICD-10-CM | POA: Diagnosis not present

## 2017-06-07 DIAGNOSIS — R296 Repeated falls: Secondary | ICD-10-CM | POA: Diagnosis not present

## 2017-06-07 DIAGNOSIS — M25552 Pain in left hip: Secondary | ICD-10-CM | POA: Diagnosis not present

## 2017-06-07 DIAGNOSIS — J180 Bronchopneumonia, unspecified organism: Secondary | ICD-10-CM | POA: Diagnosis not present

## 2017-06-18 DIAGNOSIS — Z09 Encounter for follow-up examination after completed treatment for conditions other than malignant neoplasm: Secondary | ICD-10-CM | POA: Diagnosis not present

## 2017-07-04 DIAGNOSIS — M6281 Muscle weakness (generalized): Secondary | ICD-10-CM | POA: Diagnosis not present

## 2017-07-04 DIAGNOSIS — R2689 Other abnormalities of gait and mobility: Secondary | ICD-10-CM | POA: Diagnosis not present

## 2017-07-09 DIAGNOSIS — M6281 Muscle weakness (generalized): Secondary | ICD-10-CM | POA: Diagnosis not present

## 2017-07-09 DIAGNOSIS — R2689 Other abnormalities of gait and mobility: Secondary | ICD-10-CM | POA: Diagnosis not present

## 2017-07-22 DIAGNOSIS — J069 Acute upper respiratory infection, unspecified: Secondary | ICD-10-CM | POA: Diagnosis not present

## 2017-07-23 DIAGNOSIS — R2689 Other abnormalities of gait and mobility: Secondary | ICD-10-CM | POA: Diagnosis not present

## 2017-07-23 DIAGNOSIS — M6281 Muscle weakness (generalized): Secondary | ICD-10-CM | POA: Diagnosis not present

## 2017-08-01 DIAGNOSIS — R2689 Other abnormalities of gait and mobility: Secondary | ICD-10-CM | POA: Diagnosis not present

## 2017-08-01 DIAGNOSIS — M6281 Muscle weakness (generalized): Secondary | ICD-10-CM | POA: Diagnosis not present

## 2017-08-26 DIAGNOSIS — S20229A Contusion of unspecified back wall of thorax, initial encounter: Secondary | ICD-10-CM | POA: Diagnosis not present

## 2017-09-18 DIAGNOSIS — Z23 Encounter for immunization: Secondary | ICD-10-CM | POA: Diagnosis not present

## 2017-09-18 DIAGNOSIS — Z0000 Encounter for general adult medical examination without abnormal findings: Secondary | ICD-10-CM | POA: Diagnosis not present

## 2017-09-18 DIAGNOSIS — E1142 Type 2 diabetes mellitus with diabetic polyneuropathy: Secondary | ICD-10-CM | POA: Diagnosis not present

## 2017-09-18 DIAGNOSIS — J449 Chronic obstructive pulmonary disease, unspecified: Secondary | ICD-10-CM | POA: Diagnosis not present

## 2017-09-23 ENCOUNTER — Emergency Department (HOSPITAL_COMMUNITY)
Admission: EM | Admit: 2017-09-23 | Discharge: 2017-09-23 | Disposition: A | Discharge status: Other Acute Inpatient Hospital | Payer: PPO

## 2017-09-23 DIAGNOSIS — S0081XA Abrasion of other part of head, initial encounter: Secondary | ICD-10-CM | POA: Diagnosis not present

## 2017-09-23 DIAGNOSIS — S299XXA Unspecified injury of thorax, initial encounter: Secondary | ICD-10-CM | POA: Diagnosis not present

## 2017-09-23 DIAGNOSIS — I509 Heart failure, unspecified: Secondary | ICD-10-CM | POA: Diagnosis not present

## 2017-09-23 DIAGNOSIS — R0781 Pleurodynia: Secondary | ICD-10-CM | POA: Diagnosis not present

## 2017-09-23 DIAGNOSIS — S0121XA Laceration without foreign body of nose, initial encounter: Secondary | ICD-10-CM | POA: Diagnosis not present

## 2017-09-23 DIAGNOSIS — Z79899 Other long term (current) drug therapy: Secondary | ICD-10-CM | POA: Diagnosis not present

## 2017-09-23 DIAGNOSIS — S61512A Laceration without foreign body of left wrist, initial encounter: Secondary | ICD-10-CM | POA: Diagnosis not present

## 2017-09-23 DIAGNOSIS — Z23 Encounter for immunization: Secondary | ICD-10-CM | POA: Diagnosis not present

## 2017-09-23 DIAGNOSIS — S50811A Abrasion of right forearm, initial encounter: Secondary | ICD-10-CM | POA: Diagnosis not present

## 2017-09-23 DIAGNOSIS — R51 Headache: Secondary | ICD-10-CM | POA: Diagnosis not present

## 2017-09-23 DIAGNOSIS — S069X9A Unspecified intracranial injury with loss of consciousness of unspecified duration, initial encounter: Secondary | ICD-10-CM | POA: Diagnosis not present

## 2017-09-23 DIAGNOSIS — M25532 Pain in left wrist: Secondary | ICD-10-CM | POA: Diagnosis not present

## 2017-09-23 DIAGNOSIS — S0083XA Contusion of other part of head, initial encounter: Secondary | ICD-10-CM | POA: Diagnosis not present

## 2017-09-23 DIAGNOSIS — S0993XA Unspecified injury of face, initial encounter: Secondary | ICD-10-CM | POA: Diagnosis not present

## 2017-09-23 DIAGNOSIS — I2510 Atherosclerotic heart disease of native coronary artery without angina pectoris: Secondary | ICD-10-CM | POA: Diagnosis not present

## 2017-09-23 DIAGNOSIS — I252 Old myocardial infarction: Secondary | ICD-10-CM | POA: Diagnosis not present

## 2017-09-23 DIAGNOSIS — Z7984 Long term (current) use of oral hypoglycemic drugs: Secondary | ICD-10-CM | POA: Diagnosis not present

## 2017-09-23 DIAGNOSIS — J449 Chronic obstructive pulmonary disease, unspecified: Secondary | ICD-10-CM | POA: Diagnosis not present

## 2017-09-23 DIAGNOSIS — S6992XA Unspecified injury of left wrist, hand and finger(s), initial encounter: Secondary | ICD-10-CM | POA: Diagnosis not present

## 2017-09-23 DIAGNOSIS — Z87891 Personal history of nicotine dependence: Secondary | ICD-10-CM | POA: Diagnosis not present

## 2017-09-23 DIAGNOSIS — I110 Hypertensive heart disease with heart failure: Secondary | ICD-10-CM | POA: Diagnosis not present

## 2017-09-23 DIAGNOSIS — E119 Type 2 diabetes mellitus without complications: Secondary | ICD-10-CM | POA: Diagnosis not present

## 2017-10-22 DIAGNOSIS — L570 Actinic keratosis: Secondary | ICD-10-CM | POA: Diagnosis not present

## 2017-10-22 DIAGNOSIS — L819 Disorder of pigmentation, unspecified: Secondary | ICD-10-CM | POA: Diagnosis not present

## 2017-10-22 DIAGNOSIS — D485 Neoplasm of uncertain behavior of skin: Secondary | ICD-10-CM | POA: Diagnosis not present

## 2017-10-22 DIAGNOSIS — Z85828 Personal history of other malignant neoplasm of skin: Secondary | ICD-10-CM | POA: Diagnosis not present

## 2017-10-22 DIAGNOSIS — D235 Other benign neoplasm of skin of trunk: Secondary | ICD-10-CM | POA: Diagnosis not present

## 2017-10-24 DIAGNOSIS — R21 Rash and other nonspecific skin eruption: Secondary | ICD-10-CM | POA: Diagnosis not present

## 2017-12-12 ENCOUNTER — Ambulatory Visit (INDEPENDENT_AMBULATORY_CARE_PROVIDER_SITE_OTHER): Payer: PPO

## 2017-12-12 ENCOUNTER — Encounter: Payer: Self-pay | Admitting: Podiatry

## 2017-12-12 ENCOUNTER — Ambulatory Visit: Payer: PPO | Admitting: Podiatry

## 2017-12-12 DIAGNOSIS — M25571 Pain in right ankle and joints of right foot: Secondary | ICD-10-CM | POA: Diagnosis not present

## 2017-12-12 DIAGNOSIS — M779 Enthesopathy, unspecified: Secondary | ICD-10-CM | POA: Diagnosis not present

## 2017-12-12 MED ORDER — TRIAMCINOLONE ACETONIDE 10 MG/ML IJ SUSP
10.0000 mg | Freq: Once | INTRAMUSCULAR | Status: AC
  Administered 2017-12-12: 10 mg

## 2017-12-12 NOTE — Progress Notes (Signed)
Subjective:   Patient ID: Gordon Soto, male   DOB: 84 y.o.   MRN: [id number]   HPI Patient presents with exquisite discomfort in the right ankle stating that he needs another injection   ROS      Objective:  Physical Exam  Neurovascular status intact with inflammation pain of the sinus tarsi right     Assessment:  Sinus tarsitis right with inflammation     Plan:  Injected the sinus tarsi subtalar joint 3 mg Kenalog 5 mg Xylocaine and reappoint as needed

## 2017-12-18 DIAGNOSIS — I129 Hypertensive chronic kidney disease with stage 1 through stage 4 chronic kidney disease, or unspecified chronic kidney disease: Secondary | ICD-10-CM | POA: Diagnosis not present

## 2017-12-18 DIAGNOSIS — E1122 Type 2 diabetes mellitus with diabetic chronic kidney disease: Secondary | ICD-10-CM | POA: Diagnosis not present

## 2017-12-18 DIAGNOSIS — J449 Chronic obstructive pulmonary disease, unspecified: Secondary | ICD-10-CM | POA: Diagnosis not present

## 2017-12-18 DIAGNOSIS — D631 Anemia in chronic kidney disease: Secondary | ICD-10-CM | POA: Diagnosis not present

## 2017-12-18 DIAGNOSIS — N183 Chronic kidney disease, stage 3 (moderate): Secondary | ICD-10-CM | POA: Diagnosis not present

## 2017-12-18 DIAGNOSIS — N2581 Secondary hyperparathyroidism of renal origin: Secondary | ICD-10-CM | POA: Diagnosis not present

## 2017-12-20 ENCOUNTER — Other Ambulatory Visit: Payer: Self-pay | Admitting: Nephrology

## 2017-12-20 DIAGNOSIS — N183 Chronic kidney disease, stage 3 unspecified: Secondary | ICD-10-CM

## 2017-12-20 DIAGNOSIS — N2581 Secondary hyperparathyroidism of renal origin: Secondary | ICD-10-CM

## 2017-12-20 DIAGNOSIS — E1322 Other specified diabetes mellitus with diabetic chronic kidney disease: Secondary | ICD-10-CM

## 2017-12-20 DIAGNOSIS — I129 Hypertensive chronic kidney disease with stage 1 through stage 4 chronic kidney disease, or unspecified chronic kidney disease: Secondary | ICD-10-CM

## 2017-12-20 DIAGNOSIS — D631 Anemia in chronic kidney disease: Secondary | ICD-10-CM

## 2017-12-20 DIAGNOSIS — N189 Chronic kidney disease, unspecified: Secondary | ICD-10-CM

## 2017-12-20 DIAGNOSIS — J449 Chronic obstructive pulmonary disease, unspecified: Secondary | ICD-10-CM

## 2017-12-23 DIAGNOSIS — S8011XA Contusion of right lower leg, initial encounter: Secondary | ICD-10-CM | POA: Diagnosis not present

## 2017-12-23 DIAGNOSIS — L0390 Cellulitis, unspecified: Secondary | ICD-10-CM | POA: Diagnosis not present

## 2017-12-23 DIAGNOSIS — E1165 Type 2 diabetes mellitus with hyperglycemia: Secondary | ICD-10-CM | POA: Diagnosis not present

## 2017-12-23 DIAGNOSIS — S80811A Abrasion, right lower leg, initial encounter: Secondary | ICD-10-CM | POA: Diagnosis not present

## 2017-12-25 DIAGNOSIS — J449 Chronic obstructive pulmonary disease, unspecified: Secondary | ICD-10-CM | POA: Diagnosis not present

## 2017-12-25 DIAGNOSIS — F329 Major depressive disorder, single episode, unspecified: Secondary | ICD-10-CM | POA: Diagnosis not present

## 2017-12-25 DIAGNOSIS — N183 Chronic kidney disease, stage 3 (moderate): Secondary | ICD-10-CM | POA: Diagnosis not present

## 2017-12-25 DIAGNOSIS — E1142 Type 2 diabetes mellitus with diabetic polyneuropathy: Secondary | ICD-10-CM | POA: Diagnosis not present

## 2017-12-25 DIAGNOSIS — Z7984 Long term (current) use of oral hypoglycemic drugs: Secondary | ICD-10-CM | POA: Diagnosis not present

## 2017-12-25 DIAGNOSIS — E7801 Familial hypercholesterolemia: Secondary | ICD-10-CM | POA: Diagnosis not present

## 2017-12-25 DIAGNOSIS — E1122 Type 2 diabetes mellitus with diabetic chronic kidney disease: Secondary | ICD-10-CM | POA: Diagnosis not present

## 2017-12-25 DIAGNOSIS — I129 Hypertensive chronic kidney disease with stage 1 through stage 4 chronic kidney disease, or unspecified chronic kidney disease: Secondary | ICD-10-CM | POA: Diagnosis not present

## 2017-12-26 DIAGNOSIS — E1142 Type 2 diabetes mellitus with diabetic polyneuropathy: Secondary | ICD-10-CM | POA: Diagnosis not present

## 2017-12-26 DIAGNOSIS — N183 Chronic kidney disease, stage 3 (moderate): Secondary | ICD-10-CM | POA: Diagnosis not present

## 2017-12-26 DIAGNOSIS — I129 Hypertensive chronic kidney disease with stage 1 through stage 4 chronic kidney disease, or unspecified chronic kidney disease: Secondary | ICD-10-CM | POA: Diagnosis not present

## 2018-01-01 ENCOUNTER — Ambulatory Visit
Admission: RE | Admit: 2018-01-01 | Discharge: 2018-01-01 | Disposition: A | Discharge status: Home / Self Care | Payer: PPO | Source: Ambulatory Visit | Attending: Nephrology | Admitting: Nephrology

## 2018-01-01 DIAGNOSIS — E1322 Other specified diabetes mellitus with diabetic chronic kidney disease: Secondary | ICD-10-CM

## 2018-01-01 DIAGNOSIS — J449 Chronic obstructive pulmonary disease, unspecified: Secondary | ICD-10-CM

## 2018-01-01 DIAGNOSIS — D631 Anemia in chronic kidney disease: Secondary | ICD-10-CM

## 2018-01-01 DIAGNOSIS — N2581 Secondary hyperparathyroidism of renal origin: Secondary | ICD-10-CM

## 2018-01-01 DIAGNOSIS — N189 Chronic kidney disease, unspecified: Secondary | ICD-10-CM

## 2018-01-01 DIAGNOSIS — N183 Chronic kidney disease, stage 3 unspecified: Secondary | ICD-10-CM

## 2018-01-01 DIAGNOSIS — I129 Hypertensive chronic kidney disease with stage 1 through stage 4 chronic kidney disease, or unspecified chronic kidney disease: Secondary | ICD-10-CM

## 2018-03-06 DIAGNOSIS — L819 Disorder of pigmentation, unspecified: Secondary | ICD-10-CM | POA: Diagnosis not present

## 2018-03-06 DIAGNOSIS — L570 Actinic keratosis: Secondary | ICD-10-CM | POA: Diagnosis not present

## 2018-03-06 DIAGNOSIS — Z85828 Personal history of other malignant neoplasm of skin: Secondary | ICD-10-CM | POA: Diagnosis not present

## 2018-03-08 DIAGNOSIS — M542 Cervicalgia: Secondary | ICD-10-CM | POA: Diagnosis not present

## 2018-03-13 DIAGNOSIS — N183 Chronic kidney disease, stage 3 (moderate): Secondary | ICD-10-CM | POA: Diagnosis not present

## 2018-03-13 DIAGNOSIS — D631 Anemia in chronic kidney disease: Secondary | ICD-10-CM | POA: Diagnosis not present

## 2018-03-13 DIAGNOSIS — E1122 Type 2 diabetes mellitus with diabetic chronic kidney disease: Secondary | ICD-10-CM | POA: Diagnosis not present

## 2018-03-13 DIAGNOSIS — I129 Hypertensive chronic kidney disease with stage 1 through stage 4 chronic kidney disease, or unspecified chronic kidney disease: Secondary | ICD-10-CM | POA: Diagnosis not present

## 2018-03-13 DIAGNOSIS — N189 Chronic kidney disease, unspecified: Secondary | ICD-10-CM | POA: Diagnosis not present

## 2018-03-13 DIAGNOSIS — J449 Chronic obstructive pulmonary disease, unspecified: Secondary | ICD-10-CM | POA: Diagnosis not present

## 2018-03-13 DIAGNOSIS — N2581 Secondary hyperparathyroidism of renal origin: Secondary | ICD-10-CM | POA: Diagnosis not present

## 2018-03-19 DIAGNOSIS — I509 Heart failure, unspecified: Secondary | ICD-10-CM | POA: Diagnosis not present

## 2018-03-19 DIAGNOSIS — Z7951 Long term (current) use of inhaled steroids: Secondary | ICD-10-CM | POA: Diagnosis not present

## 2018-03-19 DIAGNOSIS — Z9049 Acquired absence of other specified parts of digestive tract: Secondary | ICD-10-CM | POA: Diagnosis not present

## 2018-03-19 DIAGNOSIS — S0031XA Abrasion of nose, initial encounter: Secondary | ICD-10-CM | POA: Diagnosis not present

## 2018-03-19 DIAGNOSIS — E119 Type 2 diabetes mellitus without complications: Secondary | ICD-10-CM | POA: Diagnosis not present

## 2018-03-19 DIAGNOSIS — Z79899 Other long term (current) drug therapy: Secondary | ICD-10-CM | POA: Diagnosis not present

## 2018-03-19 DIAGNOSIS — M542 Cervicalgia: Secondary | ICD-10-CM | POA: Diagnosis not present

## 2018-03-19 DIAGNOSIS — J449 Chronic obstructive pulmonary disease, unspecified: Secondary | ICD-10-CM | POA: Diagnosis not present

## 2018-03-19 DIAGNOSIS — R930 Abnormal findings on diagnostic imaging of skull and head, not elsewhere classified: Secondary | ICD-10-CM | POA: Diagnosis not present

## 2018-03-19 DIAGNOSIS — S0990XA Unspecified injury of head, initial encounter: Secondary | ICD-10-CM | POA: Diagnosis not present

## 2018-03-19 DIAGNOSIS — Z87891 Personal history of nicotine dependence: Secondary | ICD-10-CM | POA: Diagnosis not present

## 2018-03-19 DIAGNOSIS — I252 Old myocardial infarction: Secondary | ICD-10-CM | POA: Diagnosis not present

## 2018-03-19 DIAGNOSIS — S199XXA Unspecified injury of neck, initial encounter: Secondary | ICD-10-CM | POA: Diagnosis not present

## 2018-03-19 DIAGNOSIS — Z7982 Long term (current) use of aspirin: Secondary | ICD-10-CM | POA: Diagnosis not present

## 2018-03-19 DIAGNOSIS — Z7984 Long term (current) use of oral hypoglycemic drugs: Secondary | ICD-10-CM | POA: Diagnosis not present

## 2018-03-19 DIAGNOSIS — I2510 Atherosclerotic heart disease of native coronary artery without angina pectoris: Secondary | ICD-10-CM | POA: Diagnosis not present

## 2018-03-19 DIAGNOSIS — I110 Hypertensive heart disease with heart failure: Secondary | ICD-10-CM | POA: Diagnosis not present

## 2018-03-19 DIAGNOSIS — F419 Anxiety disorder, unspecified: Secondary | ICD-10-CM | POA: Diagnosis not present

## 2018-03-24 DIAGNOSIS — R2689 Other abnormalities of gait and mobility: Secondary | ICD-10-CM | POA: Diagnosis not present

## 2018-03-24 DIAGNOSIS — M6281 Muscle weakness (generalized): Secondary | ICD-10-CM | POA: Diagnosis not present

## 2018-04-01 DIAGNOSIS — M5030 Other cervical disc degeneration, unspecified cervical region: Secondary | ICD-10-CM | POA: Diagnosis not present

## 2018-04-09 DIAGNOSIS — I129 Hypertensive chronic kidney disease with stage 1 through stage 4 chronic kidney disease, or unspecified chronic kidney disease: Secondary | ICD-10-CM | POA: Diagnosis not present

## 2018-04-09 DIAGNOSIS — Z87891 Personal history of nicotine dependence: Secondary | ICD-10-CM | POA: Diagnosis not present

## 2018-04-09 DIAGNOSIS — E1142 Type 2 diabetes mellitus with diabetic polyneuropathy: Secondary | ICD-10-CM | POA: Diagnosis not present

## 2018-04-09 DIAGNOSIS — K219 Gastro-esophageal reflux disease without esophagitis: Secondary | ICD-10-CM | POA: Diagnosis not present

## 2018-04-09 DIAGNOSIS — J449 Chronic obstructive pulmonary disease, unspecified: Secondary | ICD-10-CM | POA: Diagnosis not present

## 2018-04-09 DIAGNOSIS — E782 Mixed hyperlipidemia: Secondary | ICD-10-CM | POA: Diagnosis not present

## 2018-04-09 DIAGNOSIS — Z7984 Long term (current) use of oral hypoglycemic drugs: Secondary | ICD-10-CM | POA: Diagnosis not present

## 2018-04-09 DIAGNOSIS — I10 Essential (primary) hypertension: Secondary | ICD-10-CM | POA: Diagnosis not present

## 2018-04-09 DIAGNOSIS — E1122 Type 2 diabetes mellitus with diabetic chronic kidney disease: Secondary | ICD-10-CM | POA: Diagnosis not present

## 2018-04-09 DIAGNOSIS — N183 Chronic kidney disease, stage 3 (moderate): Secondary | ICD-10-CM | POA: Diagnosis not present

## 2018-04-17 DIAGNOSIS — M5030 Other cervical disc degeneration, unspecified cervical region: Secondary | ICD-10-CM | POA: Diagnosis not present

## 2018-05-01 DIAGNOSIS — M6281 Muscle weakness (generalized): Secondary | ICD-10-CM | POA: Diagnosis not present

## 2018-05-01 DIAGNOSIS — R2689 Other abnormalities of gait and mobility: Secondary | ICD-10-CM | POA: Diagnosis not present

## 2018-05-02 DIAGNOSIS — M47812 Spondylosis without myelopathy or radiculopathy, cervical region: Secondary | ICD-10-CM | POA: Diagnosis not present

## 2018-05-07 DIAGNOSIS — R2689 Other abnormalities of gait and mobility: Secondary | ICD-10-CM | POA: Diagnosis not present

## 2018-05-07 DIAGNOSIS — M6281 Muscle weakness (generalized): Secondary | ICD-10-CM | POA: Diagnosis not present

## 2018-05-14 DIAGNOSIS — R2689 Other abnormalities of gait and mobility: Secondary | ICD-10-CM | POA: Diagnosis not present

## 2018-05-14 DIAGNOSIS — M6281 Muscle weakness (generalized): Secondary | ICD-10-CM | POA: Diagnosis not present

## 2018-05-20 DIAGNOSIS — H04123 Dry eye syndrome of bilateral lacrimal glands: Secondary | ICD-10-CM | POA: Diagnosis not present

## 2018-05-20 DIAGNOSIS — H35033 Hypertensive retinopathy, bilateral: Secondary | ICD-10-CM | POA: Diagnosis not present

## 2018-05-20 DIAGNOSIS — E119 Type 2 diabetes mellitus without complications: Secondary | ICD-10-CM | POA: Diagnosis not present

## 2018-05-20 DIAGNOSIS — Z961 Presence of intraocular lens: Secondary | ICD-10-CM | POA: Diagnosis not present

## 2018-05-21 DIAGNOSIS — M6281 Muscle weakness (generalized): Secondary | ICD-10-CM | POA: Diagnosis not present

## 2018-05-21 DIAGNOSIS — R2689 Other abnormalities of gait and mobility: Secondary | ICD-10-CM | POA: Diagnosis not present

## 2018-05-27 DIAGNOSIS — M47812 Spondylosis without myelopathy or radiculopathy, cervical region: Secondary | ICD-10-CM | POA: Diagnosis not present

## 2018-06-04 DIAGNOSIS — M6281 Muscle weakness (generalized): Secondary | ICD-10-CM | POA: Diagnosis not present

## 2018-06-04 DIAGNOSIS — R2689 Other abnormalities of gait and mobility: Secondary | ICD-10-CM | POA: Diagnosis not present

## 2018-06-10 DIAGNOSIS — R2689 Other abnormalities of gait and mobility: Secondary | ICD-10-CM | POA: Diagnosis not present

## 2018-06-10 DIAGNOSIS — M6281 Muscle weakness (generalized): Secondary | ICD-10-CM | POA: Diagnosis not present

## 2018-06-18 DIAGNOSIS — M6281 Muscle weakness (generalized): Secondary | ICD-10-CM | POA: Diagnosis not present

## 2018-06-18 DIAGNOSIS — R2689 Other abnormalities of gait and mobility: Secondary | ICD-10-CM | POA: Diagnosis not present

## 2018-06-27 ENCOUNTER — Ambulatory Visit: Payer: PPO | Admitting: Podiatry

## 2018-06-27 DIAGNOSIS — M79675 Pain in left toe(s): Secondary | ICD-10-CM

## 2018-06-27 DIAGNOSIS — M779 Enthesopathy, unspecified: Secondary | ICD-10-CM | POA: Diagnosis not present

## 2018-06-27 DIAGNOSIS — M79674 Pain in right toe(s): Secondary | ICD-10-CM | POA: Diagnosis not present

## 2018-06-27 DIAGNOSIS — B351 Tinea unguium: Secondary | ICD-10-CM

## 2018-06-27 MED ORDER — TRIAMCINOLONE ACETONIDE 10 MG/ML IJ SUSP
10.0000 mg | Freq: Once | INTRAMUSCULAR | Status: AC
  Administered 2018-06-27: 10 mg

## 2018-06-28 NOTE — Progress Notes (Signed)
Subjective:   Patient ID: Gordon Soto, male   DOB: 85 y.o.   MRN: [id number]   HPI Patient presents stating I need my nails taken care of and I am having pain in this right ankle and it did do well for a good period of time   ROS      Objective:  Physical Exam  Neurovascular status intact with patient's right sinus tarsi shown to be quite inflamed with nail disease and thickness 1-5 both feet     Assessment:  Mycotic nail infection with pain 1-5 both feet sinus tarsitis right     Plan:  H&P both conditions reviewed and today sterile prep administered and injected the sinus tarsi 3 mg Kenalog 5 mg Xylocaine and debrided nailbeds 1-5 both feet with no iatrogenic bleeding noted

## 2018-06-29 DIAGNOSIS — Z7951 Long term (current) use of inhaled steroids: Secondary | ICD-10-CM | POA: Diagnosis not present

## 2018-06-29 DIAGNOSIS — Z87891 Personal history of nicotine dependence: Secondary | ICD-10-CM | POA: Diagnosis not present

## 2018-06-29 DIAGNOSIS — R0602 Shortness of breath: Secondary | ICD-10-CM | POA: Diagnosis not present

## 2018-06-29 DIAGNOSIS — I509 Heart failure, unspecified: Secondary | ICD-10-CM | POA: Diagnosis not present

## 2018-06-29 DIAGNOSIS — I10 Essential (primary) hypertension: Secondary | ICD-10-CM | POA: Diagnosis not present

## 2018-06-29 DIAGNOSIS — I110 Hypertensive heart disease with heart failure: Secondary | ICD-10-CM | POA: Diagnosis not present

## 2018-06-29 DIAGNOSIS — Z7982 Long term (current) use of aspirin: Secondary | ICD-10-CM | POA: Diagnosis not present

## 2018-06-29 DIAGNOSIS — Z79899 Other long term (current) drug therapy: Secondary | ICD-10-CM | POA: Diagnosis not present

## 2018-06-29 DIAGNOSIS — I252 Old myocardial infarction: Secondary | ICD-10-CM | POA: Diagnosis not present

## 2018-06-29 DIAGNOSIS — E119 Type 2 diabetes mellitus without complications: Secondary | ICD-10-CM | POA: Diagnosis not present

## 2018-06-29 DIAGNOSIS — M542 Cervicalgia: Secondary | ICD-10-CM | POA: Diagnosis not present

## 2018-06-29 DIAGNOSIS — J449 Chronic obstructive pulmonary disease, unspecified: Secondary | ICD-10-CM | POA: Diagnosis not present

## 2018-06-29 DIAGNOSIS — I2510 Atherosclerotic heart disease of native coronary artery without angina pectoris: Secondary | ICD-10-CM | POA: Diagnosis not present

## 2018-07-10 DIAGNOSIS — D485 Neoplasm of uncertain behavior of skin: Secondary | ICD-10-CM | POA: Diagnosis not present

## 2018-07-10 DIAGNOSIS — L570 Actinic keratosis: Secondary | ICD-10-CM | POA: Diagnosis not present

## 2018-07-10 DIAGNOSIS — L280 Lichen simplex chronicus: Secondary | ICD-10-CM | POA: Diagnosis not present

## 2018-07-10 DIAGNOSIS — L821 Other seborrheic keratosis: Secondary | ICD-10-CM | POA: Diagnosis not present

## 2018-07-10 DIAGNOSIS — Z85828 Personal history of other malignant neoplasm of skin: Secondary | ICD-10-CM | POA: Diagnosis not present

## 2018-07-10 DIAGNOSIS — L814 Other melanin hyperpigmentation: Secondary | ICD-10-CM | POA: Diagnosis not present

## 2018-07-10 DIAGNOSIS — J449 Chronic obstructive pulmonary disease, unspecified: Secondary | ICD-10-CM | POA: Diagnosis not present

## 2018-07-10 DIAGNOSIS — E1165 Type 2 diabetes mellitus with hyperglycemia: Secondary | ICD-10-CM | POA: Diagnosis not present

## 2018-07-10 DIAGNOSIS — D229 Melanocytic nevi, unspecified: Secondary | ICD-10-CM | POA: Diagnosis not present

## 2018-07-10 DIAGNOSIS — L819 Disorder of pigmentation, unspecified: Secondary | ICD-10-CM | POA: Diagnosis not present

## 2018-07-10 DIAGNOSIS — I10 Essential (primary) hypertension: Secondary | ICD-10-CM | POA: Diagnosis not present

## 2018-07-10 DIAGNOSIS — M150 Primary generalized (osteo)arthritis: Secondary | ICD-10-CM | POA: Diagnosis not present

## 2018-07-16 IMAGING — MR MR ANKLE*R* W/O CM
3 of 5 series · 8 of 40 positions shown · non-contrast
Comparison: Right foot x-rays dated February 18, 2017.

CLINICAL DATA: Right medial ankle pain for the past 6 years.
Evaluate for tendon injury.

EXAM:
MRI OF THE RIGHT ANKLE WITHOUT CONTRAST
TECHNIQUE: Multiplanar, multisequence MR imaging of the ankle was performed. No
intravenous contrast was administered.

[Series 5: PD fat-sat · axial · right · 4.0mm · 0.20mm/px · z∈[+48,+163]mm · 3 of 36 slices shown]
[im 6/36]
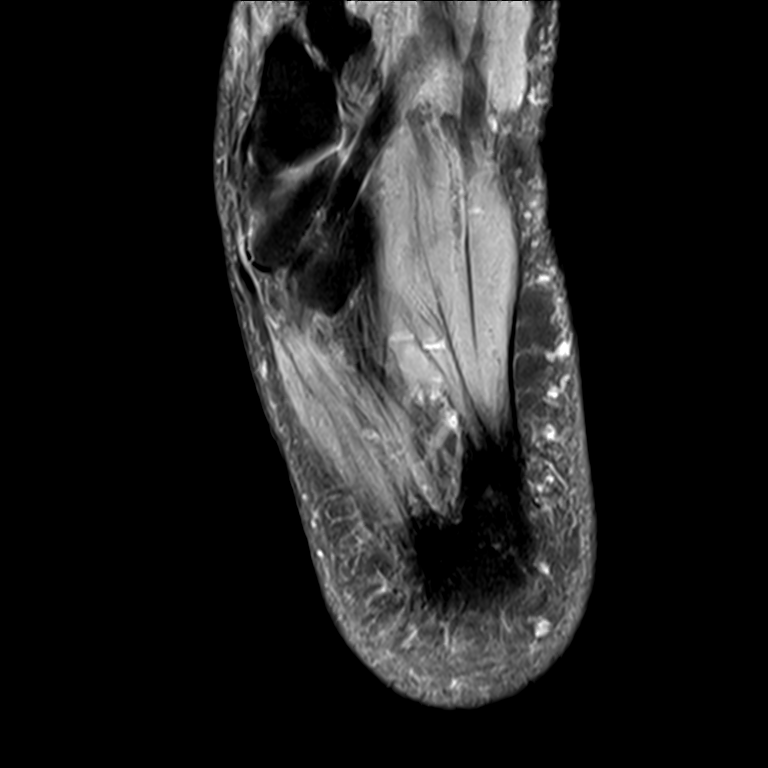
[im 21/36]
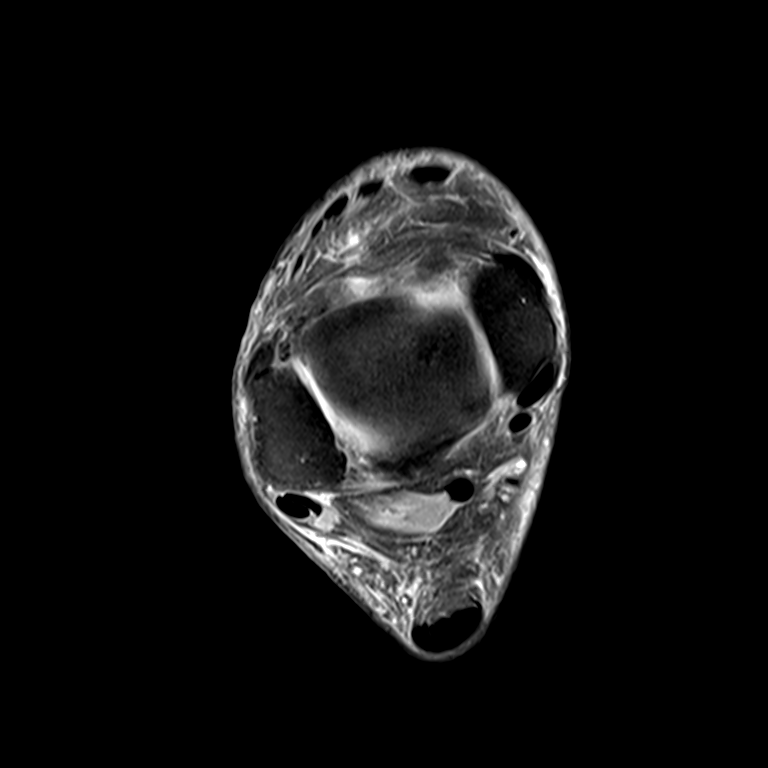
[im 31/36]
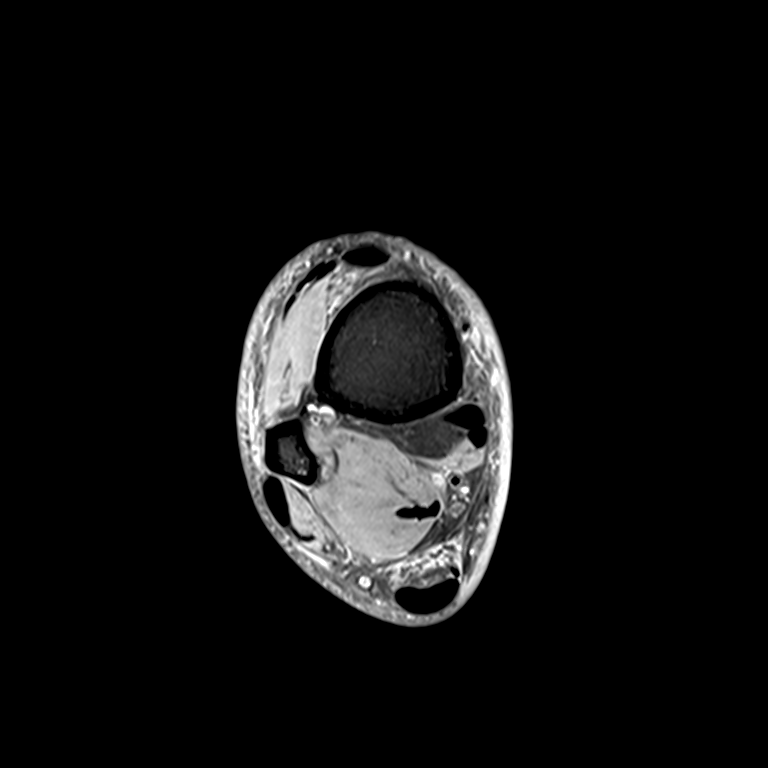

[Series 6: T2 fat-sat · axial · right · 4.0mm · 0.20mm/px · z∈[+44,+163]mm · 3 of 36 slices shown (1 of 2)]
[im 5/36]
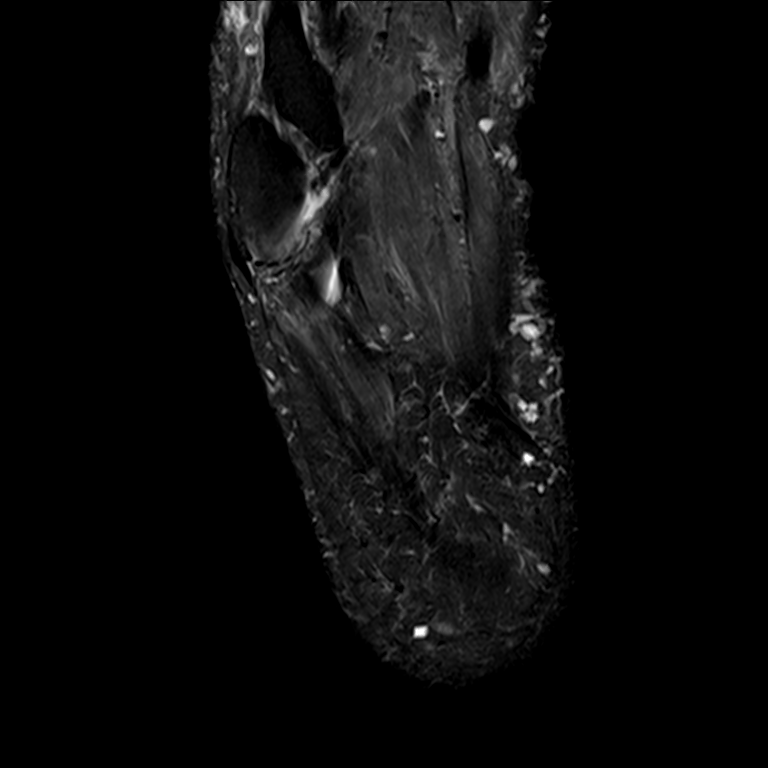
[im 18/36]
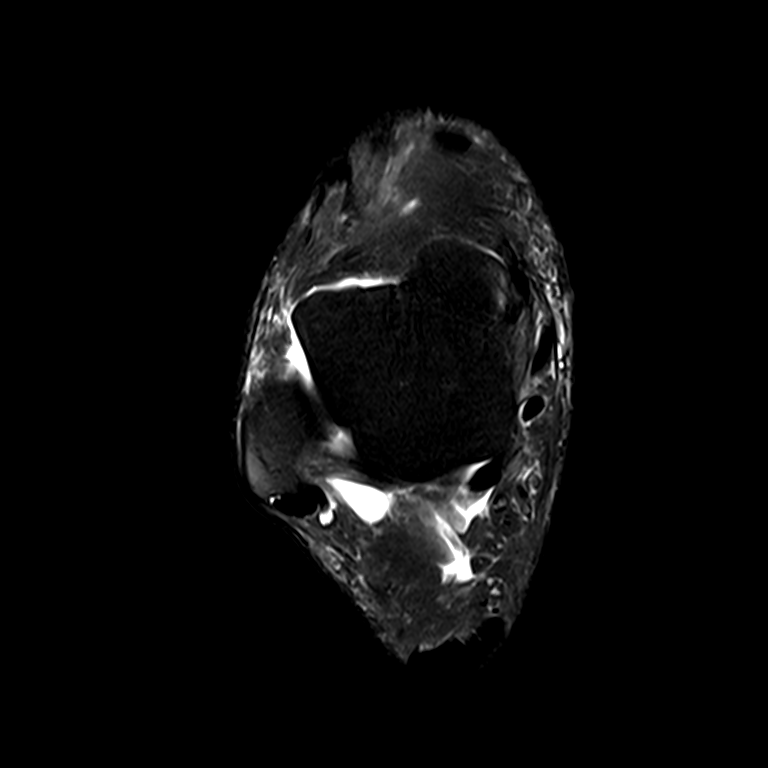
[im 31/36]
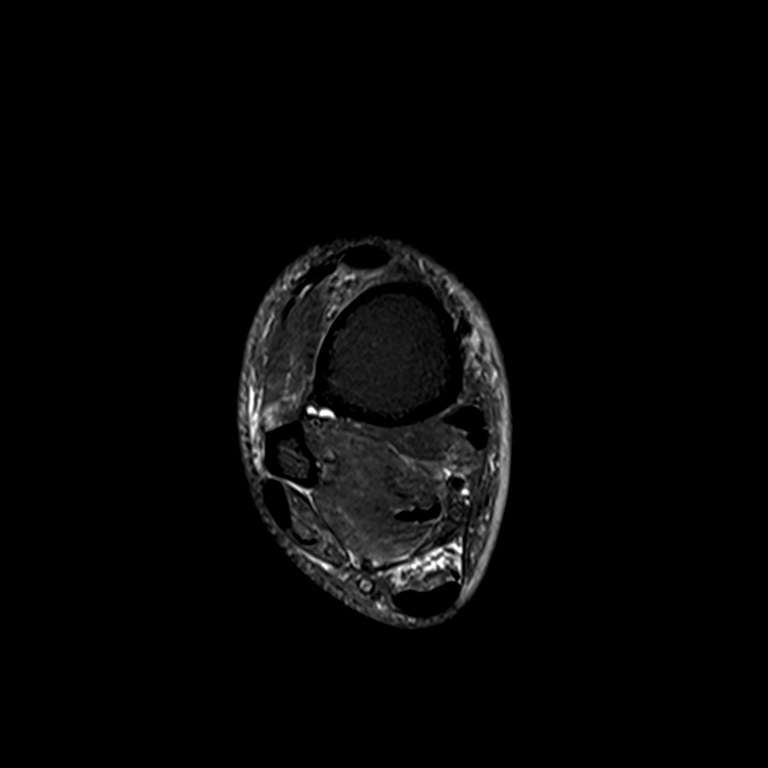

[Series 7: T2 fat-sat · sagittal · right · 2.5mm · 0.22mm/px · 2 of 30 slices shown (2 of 2)]
[im 5/30]
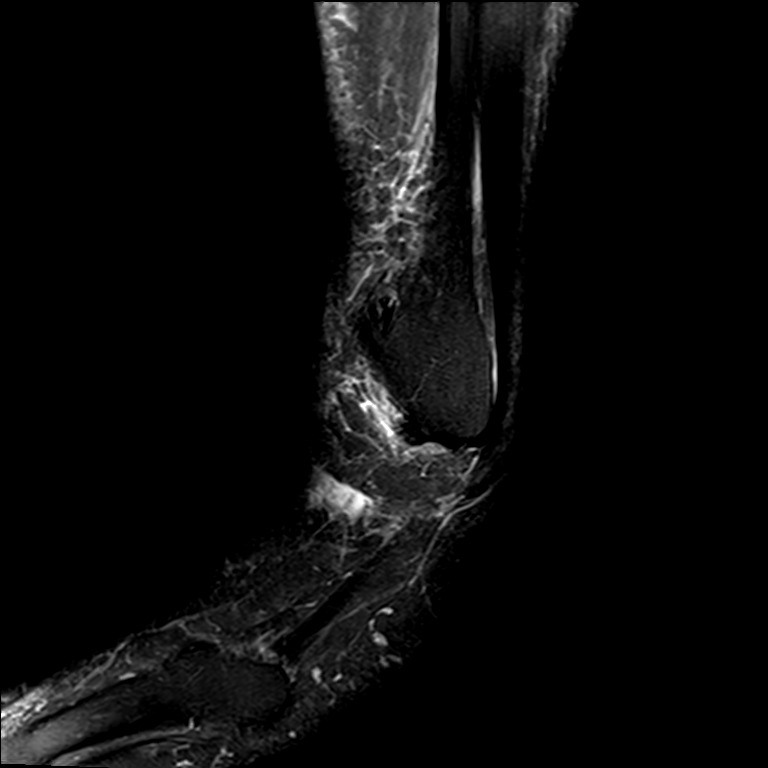
[im 15/30]
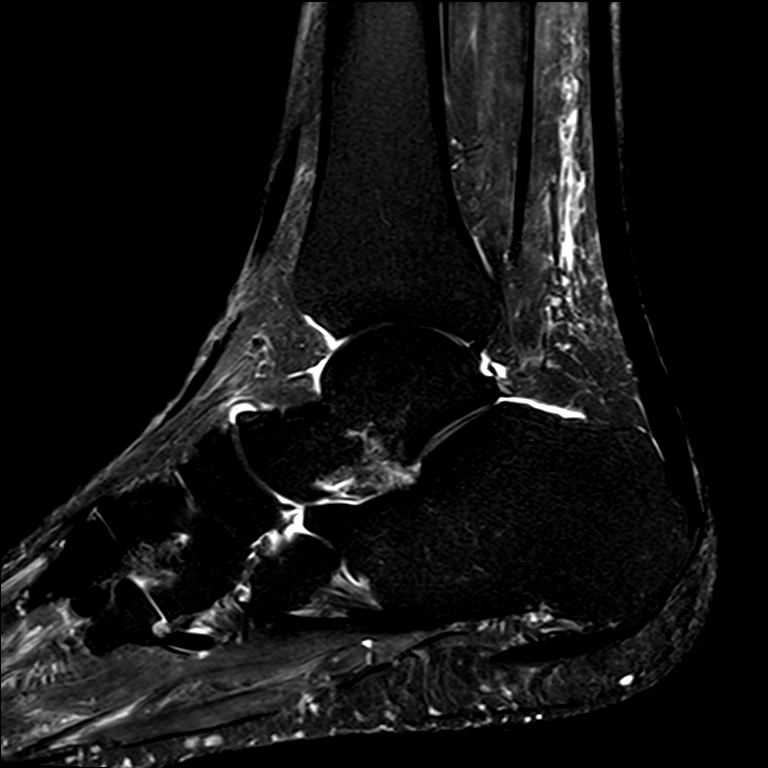

[8 of 40 positions shown; findings below may reference images not displayed]

FINDINGS: TENDONS

Peroneal: Intact peroneus longus and peroneus brevis tendons. Mild
peroneal tenosynovitis.

Posteromedial: Intact tibialis posterior, flexor hallucis longus and
flexor digitorum longus tendons. Mild posterior tibialis and flexor
digitorum longus tenosynovitis.

Anterior: Intact tibialis anterior, extensor hallucis longus and
extensor digitorum longus tendons.

Achilles: Intact.  Borderline thickened.

Plantar Fascia: Intact.  Thickening of the proximal central band.

LIGAMENTS

Lateral: Mild attenuation of the anterior talofibular ligament,
which remains intact. Findings are likely due to prior injury. The
posterior talofibular ligament, calcaneofibular ligament, and
anterior and posterior tibiofibular ligaments are intact.

Medial: The deltoid and visualized portions of the spring ligament
are intact.

CARTILAGE

Ankle Joint: Trace tibiotalar joint effusion. No chondral defect.
The talar dome and tibial plafond are intact.

Subtalar Joints/Sinus Tarsi: Small posterior subtalar joint
effusion. Small amount of edema in the sinus tarsi.

Bones: Type II os navicular. No marrow signal abnormality. No
fracture or dislocation.

Other: None.
IMPRESSION: 1. Mild posterior tibialis, flexor digitorum longus, and peroneal
tenosynovitis. No tendon tear.
2. Thickening of the plantar fascia, consistent with prior plantar
fasciitis.
3. Type II os navicular without evidence of degenerative changes.
4. Trace tibiotalar and posterior subtalar joint effusions.
5. Small amount of edema in the sinus tarsi. Correlate for sinus
tarsi syndrome.

## 2018-09-06 DIAGNOSIS — M545 Low back pain: Secondary | ICD-10-CM | POA: Diagnosis not present

## 2018-10-20 DIAGNOSIS — I10 Essential (primary) hypertension: Secondary | ICD-10-CM | POA: Diagnosis not present

## 2018-10-20 DIAGNOSIS — F3341 Major depressive disorder, recurrent, in partial remission: Secondary | ICD-10-CM | POA: Diagnosis not present

## 2018-10-20 DIAGNOSIS — Z0000 Encounter for general adult medical examination without abnormal findings: Secondary | ICD-10-CM | POA: Diagnosis not present

## 2018-10-20 DIAGNOSIS — E1165 Type 2 diabetes mellitus with hyperglycemia: Secondary | ICD-10-CM | POA: Diagnosis not present

## 2018-10-20 DIAGNOSIS — M8949 Other hypertrophic osteoarthropathy, multiple sites: Secondary | ICD-10-CM | POA: Diagnosis not present

## 2018-11-06 ENCOUNTER — Other Ambulatory Visit: Payer: Self-pay

## 2018-11-06 ENCOUNTER — Encounter: Payer: Self-pay | Admitting: Podiatry

## 2018-11-06 ENCOUNTER — Ambulatory Visit: Payer: PPO | Admitting: Podiatry

## 2018-11-06 DIAGNOSIS — M779 Enthesopathy, unspecified: Secondary | ICD-10-CM

## 2018-11-11 NOTE — Progress Notes (Signed)
Subjective:   Patient ID: Gordon Soto, male   DOB: 85 y.o.   MRN: [id number]   HPI Patient states my right ankle has become sore again and it is making it hard for me to walk and he did do well for several months   ROS      Objective:  Physical Exam  Neurovascular status intact with exquisite discomfort in the sinus tarsi right with inflammation fluid     Assessment:  Sinus tarsitis right with inflammation     Plan:  Sterile prep and injected the sinus tarsi right 3 mg Kenalog 5 mg Xylocaine advised on reduced activity and reappoint to recheck

## 2018-11-21 DIAGNOSIS — R51 Headache: Secondary | ICD-10-CM | POA: Diagnosis not present

## 2018-11-25 DIAGNOSIS — R5381 Other malaise: Secondary | ICD-10-CM | POA: Diagnosis not present

## 2018-11-25 DIAGNOSIS — F4321 Adjustment disorder with depressed mood: Secondary | ICD-10-CM | POA: Diagnosis not present

## 2018-11-25 DIAGNOSIS — E1165 Type 2 diabetes mellitus with hyperglycemia: Secondary | ICD-10-CM | POA: Diagnosis not present

## 2018-11-25 DIAGNOSIS — R5383 Other fatigue: Secondary | ICD-10-CM | POA: Diagnosis not present

## 2018-11-25 DIAGNOSIS — R9431 Abnormal electrocardiogram [ECG] [EKG]: Secondary | ICD-10-CM | POA: Diagnosis not present

## 2018-12-01 DIAGNOSIS — R079 Chest pain, unspecified: Secondary | ICD-10-CM | POA: Diagnosis not present

## 2018-12-02 DIAGNOSIS — F3341 Major depressive disorder, recurrent, in partial remission: Secondary | ICD-10-CM | POA: Diagnosis not present

## 2018-12-02 DIAGNOSIS — F5104 Psychophysiologic insomnia: Secondary | ICD-10-CM | POA: Diagnosis not present

## 2018-12-29 ENCOUNTER — Ambulatory Visit (INDEPENDENT_AMBULATORY_CARE_PROVIDER_SITE_OTHER): Payer: PPO

## 2018-12-29 ENCOUNTER — Ambulatory Visit: Payer: PPO | Admitting: Podiatry

## 2018-12-29 ENCOUNTER — Other Ambulatory Visit: Payer: Self-pay | Admitting: Podiatry

## 2018-12-29 ENCOUNTER — Other Ambulatory Visit: Payer: Self-pay

## 2018-12-29 ENCOUNTER — Encounter: Payer: Self-pay | Admitting: Podiatry

## 2018-12-29 DIAGNOSIS — S9001XA Contusion of right ankle, initial encounter: Secondary | ICD-10-CM

## 2018-12-29 DIAGNOSIS — M7751 Other enthesopathy of right foot: Secondary | ICD-10-CM | POA: Diagnosis not present

## 2018-12-29 DIAGNOSIS — S9031XA Contusion of right foot, initial encounter: Secondary | ICD-10-CM

## 2018-12-29 DIAGNOSIS — S2231XA Fracture of one rib, right side, initial encounter for closed fracture: Secondary | ICD-10-CM | POA: Diagnosis not present

## 2018-12-29 DIAGNOSIS — M779 Enthesopathy, unspecified: Secondary | ICD-10-CM

## 2018-12-29 DIAGNOSIS — R0781 Pleurodynia: Secondary | ICD-10-CM | POA: Diagnosis not present

## 2018-12-29 NOTE — Progress Notes (Signed)
Subjective:   Patient ID: Gordon Soto, male   DOB: 85 y.o.   MRN: [id number]   HPI Patient presents stating that he fell down at church and he hurt his knee and his foot he is worried that he may have broken his foot and his ankle and also his hip is hurting and states his joint is hurting and he like an injection while he is here today   ROS      Objective:  Physical Exam  Neurovascular status intact negative Homans sign was noted with quite a bit of swelling in the ankle right lateral side and into the forefoot with inflammation fluid around the sinus tarsi right     Assessment:  Possibility for fracture of the right ankle or midfoot with inflammation of the sinus tarsi right     Plan:  H&P condition reviewed and today I did sterile prep and injected the sinus tarsi right 3 mg Kenalog 5 mg Xylocaine and advised on physical therapy anti-inflammatories and supportive shoe gear usage.  Patient will be seen back for [place] to recheck and encouraged to call with concerns  X-ray indicates that the ankle is not fractured and the foot is not fracture with no indication of diastases injury

## 2019-01-07 DIAGNOSIS — D485 Neoplasm of uncertain behavior of skin: Secondary | ICD-10-CM | POA: Diagnosis not present

## 2019-01-07 DIAGNOSIS — Z85828 Personal history of other malignant neoplasm of skin: Secondary | ICD-10-CM | POA: Diagnosis not present

## 2019-01-07 DIAGNOSIS — L905 Scar conditions and fibrosis of skin: Secondary | ICD-10-CM | POA: Diagnosis not present

## 2019-01-08 DIAGNOSIS — C44622 Squamous cell carcinoma of skin of right upper limb, including shoulder: Secondary | ICD-10-CM | POA: Diagnosis not present

## 2019-02-12 DIAGNOSIS — C44622 Squamous cell carcinoma of skin of right upper limb, including shoulder: Secondary | ICD-10-CM | POA: Diagnosis not present

## 2019-02-12 DIAGNOSIS — L905 Scar conditions and fibrosis of skin: Secondary | ICD-10-CM | POA: Diagnosis not present

## 2019-02-12 DIAGNOSIS — Z85828 Personal history of other malignant neoplasm of skin: Secondary | ICD-10-CM | POA: Diagnosis not present

## 2019-02-12 DIAGNOSIS — L570 Actinic keratosis: Secondary | ICD-10-CM | POA: Diagnosis not present

## 2019-02-16 DIAGNOSIS — L308 Other specified dermatitis: Secondary | ICD-10-CM | POA: Diagnosis not present

## 2019-03-02 DIAGNOSIS — L570 Actinic keratosis: Secondary | ICD-10-CM | POA: Diagnosis not present

## 2019-03-27 DIAGNOSIS — J069 Acute upper respiratory infection, unspecified: Secondary | ICD-10-CM | POA: Diagnosis not present

## 2019-04-30 ENCOUNTER — Ambulatory Visit (INDEPENDENT_AMBULATORY_CARE_PROVIDER_SITE_OTHER): Payer: PPO | Admitting: Podiatry

## 2019-04-30 ENCOUNTER — Encounter: Payer: Self-pay | Admitting: Podiatry

## 2019-04-30 ENCOUNTER — Other Ambulatory Visit: Payer: Self-pay

## 2019-04-30 DIAGNOSIS — B351 Tinea unguium: Secondary | ICD-10-CM | POA: Diagnosis not present

## 2019-04-30 DIAGNOSIS — M79675 Pain in left toe(s): Secondary | ICD-10-CM

## 2019-04-30 DIAGNOSIS — M7751 Other enthesopathy of right foot: Secondary | ICD-10-CM | POA: Diagnosis not present

## 2019-04-30 DIAGNOSIS — M79674 Pain in right toe(s): Secondary | ICD-10-CM

## 2019-04-30 DIAGNOSIS — M779 Enthesopathy, unspecified: Secondary | ICD-10-CM

## 2019-05-04 NOTE — Progress Notes (Signed)
Subjective:   Patient ID: Gordon Soto, male   DOB: 86 y.o.   MRN: [id number]   HPI Patient presents stating he has a lot of pain in his nailbeds 1-5 both feet that he cannot take care of and his ankle is still quite sore and needs medication   ROS      Objective:  Physical Exam  Neurovascular status unchanged with exquisite discomfort in the sinus tarsi right with inflammation fluid of the sinus tarsi with thick yellow brittle nailbeds 1-5 both feet that are moderately tender     Assessment:  Mycotic nail infection with pain 1-5 both feet with inflamed capsule of the sinus tarsi right     Plan:  H&P all conditions reviewed debridement of nailbeds 1-5 both feet accomplished and I did sterile prep and injected the sinus tarsi right 3 mg Kenalog 5 mg Xylocaine and applied sterile dressing.  Reappoint to recheck

## 2019-06-30 DIAGNOSIS — B0239 Other herpes zoster eye disease: Secondary | ICD-10-CM | POA: Diagnosis not present

## 2019-07-07 DIAGNOSIS — D485 Neoplasm of uncertain behavior of skin: Secondary | ICD-10-CM | POA: Diagnosis not present

## 2019-07-07 DIAGNOSIS — Z85828 Personal history of other malignant neoplasm of skin: Secondary | ICD-10-CM | POA: Diagnosis not present

## 2019-07-07 DIAGNOSIS — L905 Scar conditions and fibrosis of skin: Secondary | ICD-10-CM | POA: Diagnosis not present

## 2019-07-08 DIAGNOSIS — C44321 Squamous cell carcinoma of skin of nose: Secondary | ICD-10-CM | POA: Diagnosis not present

## 2019-07-09 DIAGNOSIS — B0239 Other herpes zoster eye disease: Secondary | ICD-10-CM | POA: Diagnosis not present

## 2019-07-21 DIAGNOSIS — L570 Actinic keratosis: Secondary | ICD-10-CM | POA: Diagnosis not present

## 2019-07-21 DIAGNOSIS — L819 Disorder of pigmentation, unspecified: Secondary | ICD-10-CM | POA: Diagnosis not present

## 2019-07-23 DIAGNOSIS — M5412 Radiculopathy, cervical region: Secondary | ICD-10-CM | POA: Diagnosis not present

## 2019-08-11 DIAGNOSIS — L819 Disorder of pigmentation, unspecified: Secondary | ICD-10-CM | POA: Diagnosis not present

## 2019-08-11 DIAGNOSIS — M5030 Other cervical disc degeneration, unspecified cervical region: Secondary | ICD-10-CM | POA: Diagnosis not present

## 2019-08-11 DIAGNOSIS — Z85828 Personal history of other malignant neoplasm of skin: Secondary | ICD-10-CM | POA: Diagnosis not present

## 2019-08-11 DIAGNOSIS — L905 Scar conditions and fibrosis of skin: Secondary | ICD-10-CM | POA: Diagnosis not present

## 2019-09-15 DIAGNOSIS — R5381 Other malaise: Secondary | ICD-10-CM | POA: Diagnosis not present

## 2019-09-15 DIAGNOSIS — N1831 Chronic kidney disease, stage 3a: Secondary | ICD-10-CM | POA: Diagnosis not present

## 2019-09-15 DIAGNOSIS — K219 Gastro-esophageal reflux disease without esophagitis: Secondary | ICD-10-CM | POA: Diagnosis not present

## 2019-09-15 DIAGNOSIS — I10 Essential (primary) hypertension: Secondary | ICD-10-CM | POA: Diagnosis not present

## 2019-09-15 DIAGNOSIS — J449 Chronic obstructive pulmonary disease, unspecified: Secondary | ICD-10-CM | POA: Diagnosis not present

## 2019-09-15 DIAGNOSIS — I129 Hypertensive chronic kidney disease with stage 1 through stage 4 chronic kidney disease, or unspecified chronic kidney disease: Secondary | ICD-10-CM | POA: Diagnosis not present

## 2019-09-15 DIAGNOSIS — E1165 Type 2 diabetes mellitus with hyperglycemia: Secondary | ICD-10-CM | POA: Diagnosis not present

## 2019-09-15 DIAGNOSIS — E782 Mixed hyperlipidemia: Secondary | ICD-10-CM | POA: Diagnosis not present

## 2019-09-15 DIAGNOSIS — F3341 Major depressive disorder, recurrent, in partial remission: Secondary | ICD-10-CM | POA: Diagnosis not present

## 2019-09-15 DIAGNOSIS — R5383 Other fatigue: Secondary | ICD-10-CM | POA: Diagnosis not present

## 2019-10-30 ENCOUNTER — Encounter: Payer: Self-pay | Admitting: Podiatry

## 2019-10-30 ENCOUNTER — Other Ambulatory Visit: Payer: Self-pay

## 2019-10-30 ENCOUNTER — Ambulatory Visit: Payer: PPO | Admitting: Podiatry

## 2019-10-30 DIAGNOSIS — M779 Enthesopathy, unspecified: Secondary | ICD-10-CM

## 2019-11-03 NOTE — Progress Notes (Signed)
Subjective:   Patient ID: Gordon Soto, male   DOB: 86 y.o.   MRN: [id number]   HPI Patient states he is developed a lot of pain in his right ankle and has nails that can be bothersome both feet   ROS      Objective:  Physical Exam  Inflammatory capsulitis of the sinus tarsi right with improvement upon treatment with nail disease      Assessment:  Inflammatory capsulitis sinus tarsi right     Plan:  Sterile prep injected the sinus tarsi right 3 mg Kenalog 5 mg Xylocaine advised on routine care reappoint to recheck

## 2019-12-08 DIAGNOSIS — E1165 Type 2 diabetes mellitus with hyperglycemia: Secondary | ICD-10-CM | POA: Diagnosis not present

## 2019-12-08 DIAGNOSIS — I10 Essential (primary) hypertension: Secondary | ICD-10-CM | POA: Diagnosis not present

## 2019-12-08 DIAGNOSIS — E782 Mixed hyperlipidemia: Secondary | ICD-10-CM | POA: Diagnosis not present

## 2019-12-24 DIAGNOSIS — Z79899 Other long term (current) drug therapy: Secondary | ICD-10-CM | POA: Diagnosis not present

## 2019-12-24 DIAGNOSIS — E119 Type 2 diabetes mellitus without complications: Secondary | ICD-10-CM | POA: Diagnosis not present

## 2019-12-24 DIAGNOSIS — Z7982 Long term (current) use of aspirin: Secondary | ICD-10-CM | POA: Diagnosis not present

## 2019-12-24 DIAGNOSIS — Z9049 Acquired absence of other specified parts of digestive tract: Secondary | ICD-10-CM | POA: Diagnosis not present

## 2019-12-24 DIAGNOSIS — I110 Hypertensive heart disease with heart failure: Secondary | ICD-10-CM | POA: Diagnosis not present

## 2019-12-24 DIAGNOSIS — R197 Diarrhea, unspecified: Secondary | ICD-10-CM | POA: Diagnosis not present

## 2019-12-24 DIAGNOSIS — Z7951 Long term (current) use of inhaled steroids: Secondary | ICD-10-CM | POA: Diagnosis not present

## 2019-12-24 DIAGNOSIS — J449 Chronic obstructive pulmonary disease, unspecified: Secondary | ICD-10-CM | POA: Diagnosis not present

## 2019-12-24 DIAGNOSIS — F419 Anxiety disorder, unspecified: Secondary | ICD-10-CM | POA: Diagnosis not present

## 2019-12-24 DIAGNOSIS — I509 Heart failure, unspecified: Secondary | ICD-10-CM | POA: Diagnosis not present

## 2019-12-24 DIAGNOSIS — I252 Old myocardial infarction: Secondary | ICD-10-CM | POA: Diagnosis not present

## 2019-12-24 DIAGNOSIS — R1013 Epigastric pain: Secondary | ICD-10-CM | POA: Diagnosis not present

## 2019-12-24 DIAGNOSIS — I2510 Atherosclerotic heart disease of native coronary artery without angina pectoris: Secondary | ICD-10-CM | POA: Diagnosis not present

## 2019-12-24 DIAGNOSIS — Z87891 Personal history of nicotine dependence: Secondary | ICD-10-CM | POA: Diagnosis not present

## 2019-12-29 DIAGNOSIS — N1831 Chronic kidney disease, stage 3a: Secondary | ICD-10-CM | POA: Diagnosis not present

## 2019-12-29 DIAGNOSIS — I129 Hypertensive chronic kidney disease with stage 1 through stage 4 chronic kidney disease, or unspecified chronic kidney disease: Secondary | ICD-10-CM | POA: Diagnosis not present

## 2019-12-29 DIAGNOSIS — R350 Frequency of micturition: Secondary | ICD-10-CM | POA: Diagnosis not present

## 2019-12-29 DIAGNOSIS — K219 Gastro-esophageal reflux disease without esophagitis: Secondary | ICD-10-CM | POA: Diagnosis not present

## 2019-12-29 DIAGNOSIS — N179 Acute kidney failure, unspecified: Secondary | ICD-10-CM | POA: Diagnosis not present

## 2020-01-04 IMAGING — US US RENAL
1 series · 14 of 25 positions shown · non-contrast
Comparison: None.

CLINICAL DATA: Chronic renal disease, stage III

EXAM:
RENAL / URINARY TRACT ULTRASOUND COMPLETE

[Series 1: us renal · 0.23mm/px · 14 of 39 slices shown]
[im 1/39]
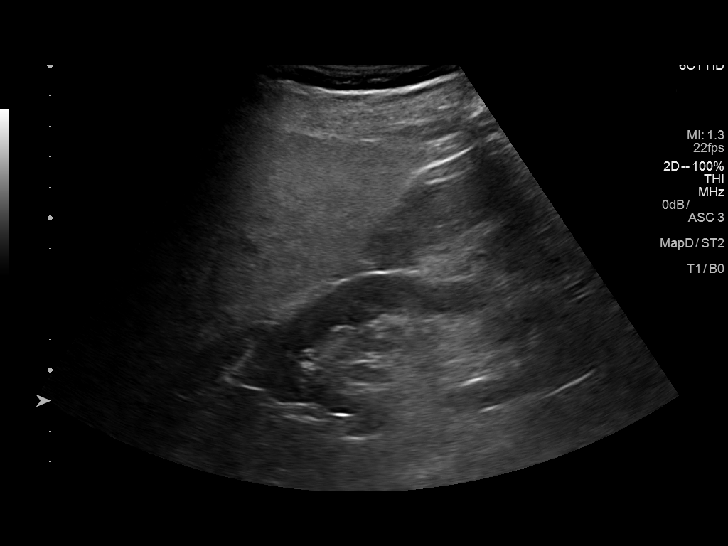
[im 4/39]
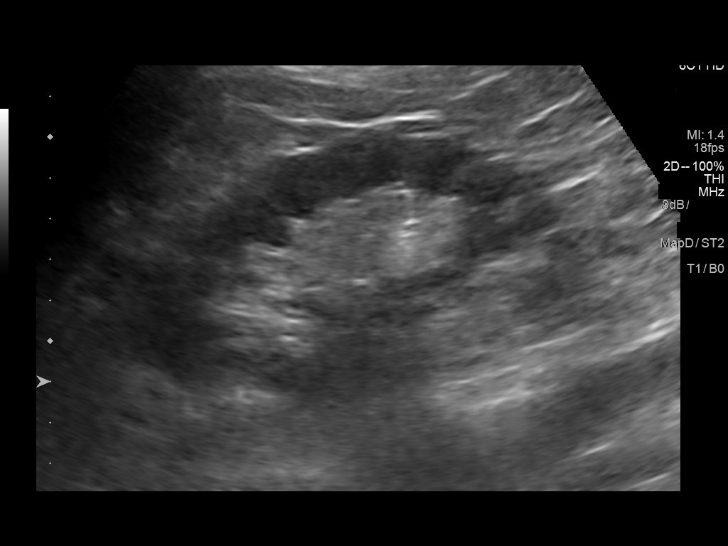
[im 7/39]
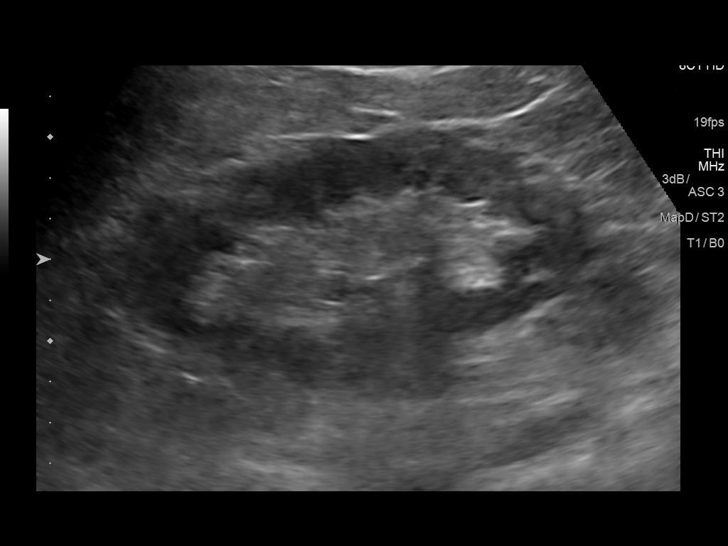
[im 10/39]
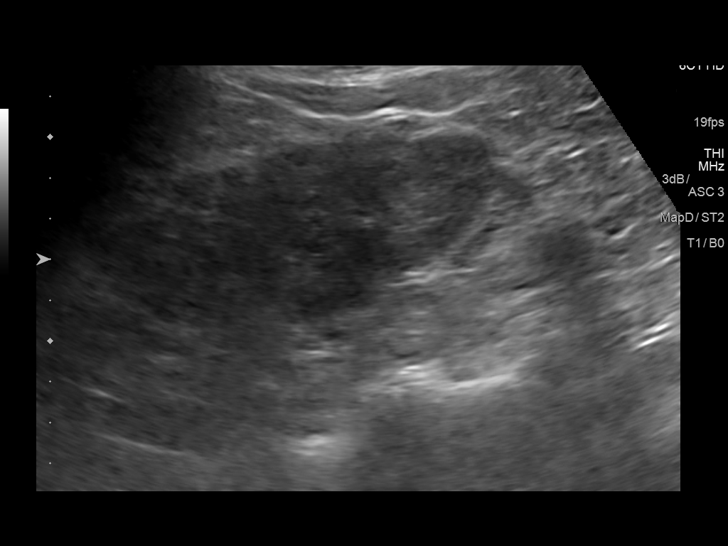
[im 13/39]
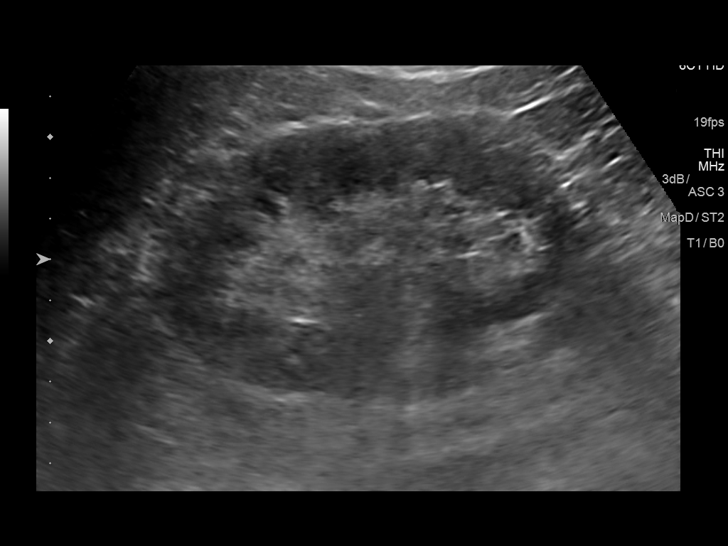
[im 15/39]
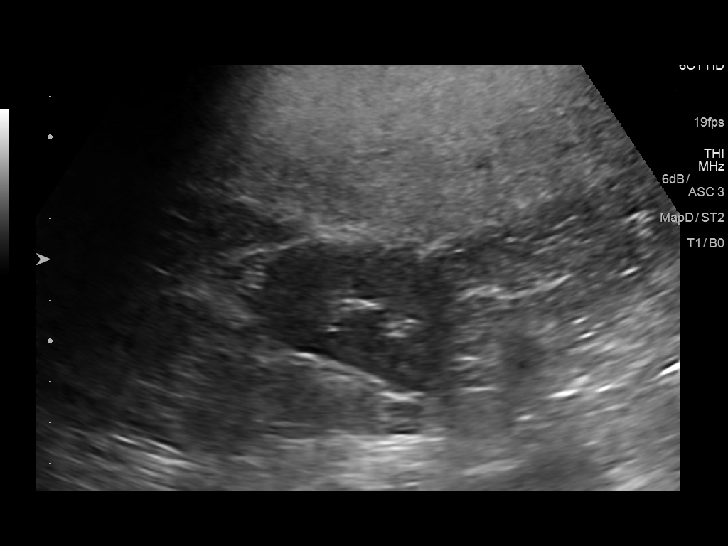
[im 18/39]
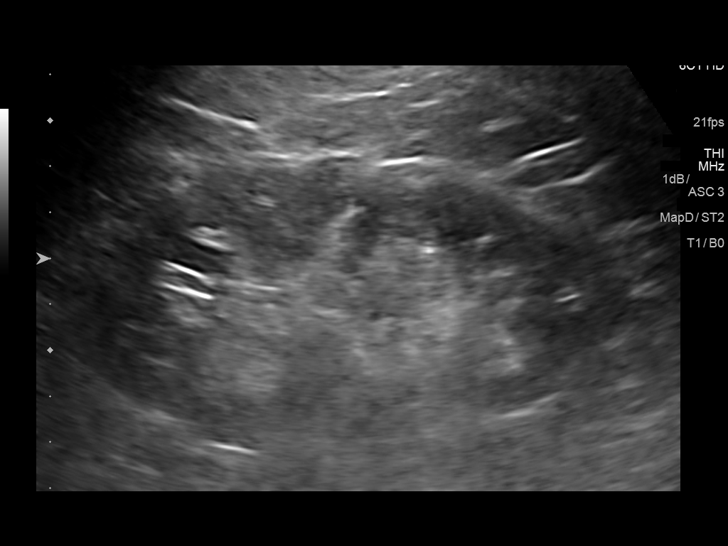
[im 21/39]
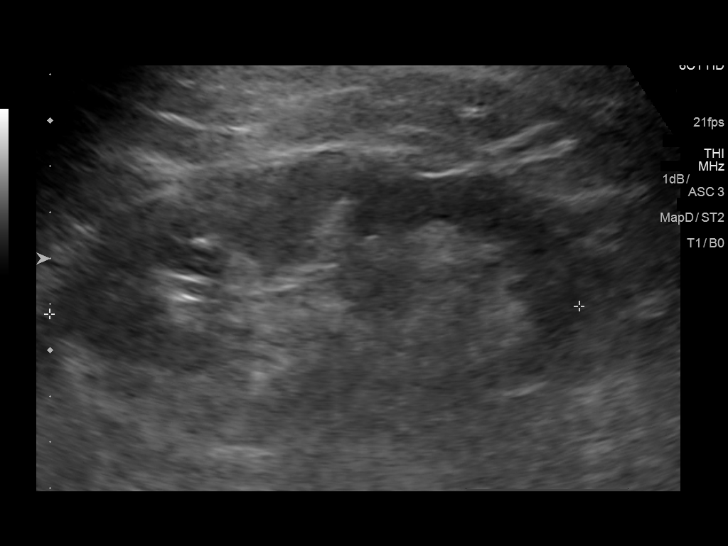
[im 24/39]
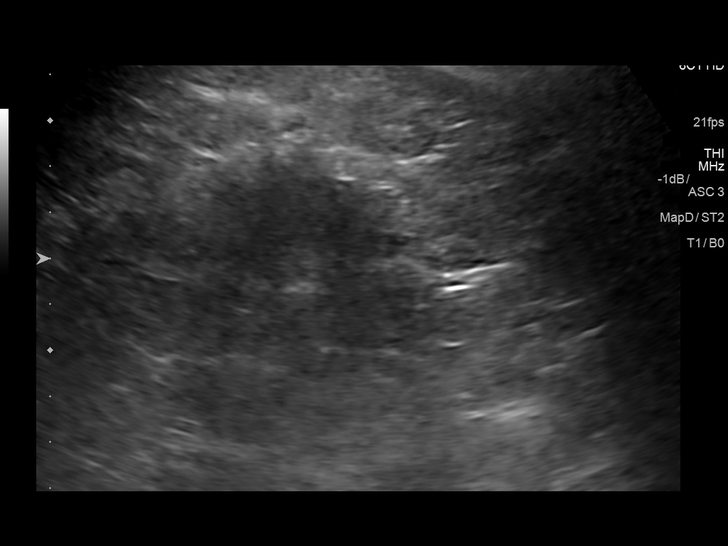
[im 26/39]
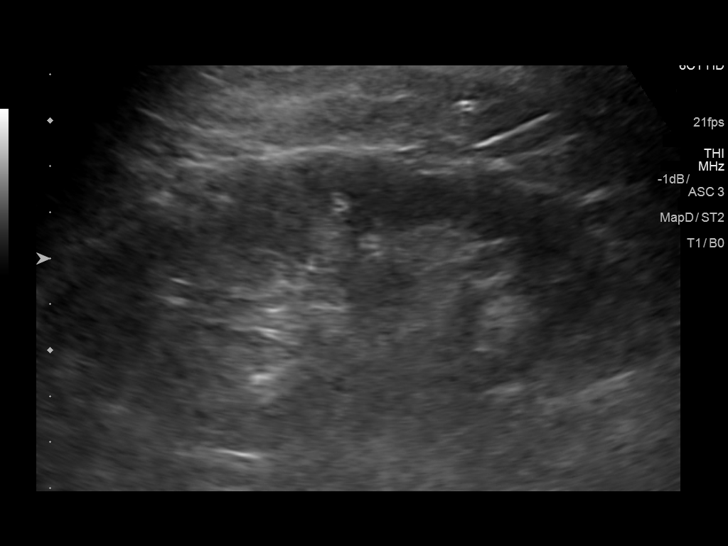
[im 29/39]
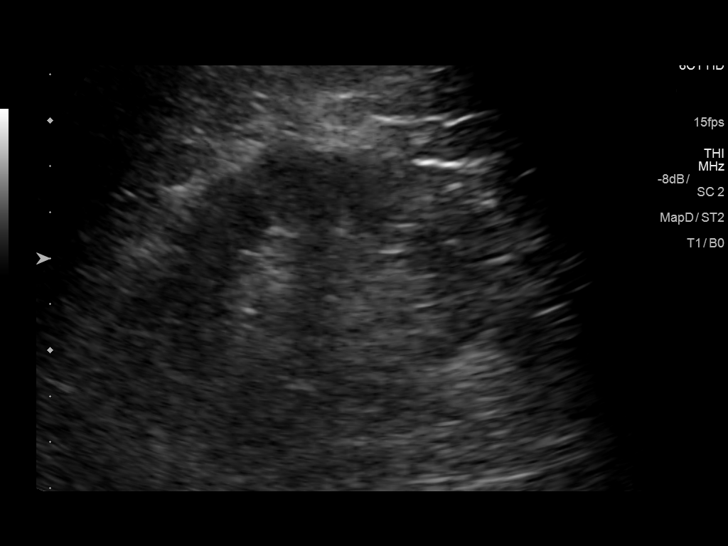
[im 32/39]
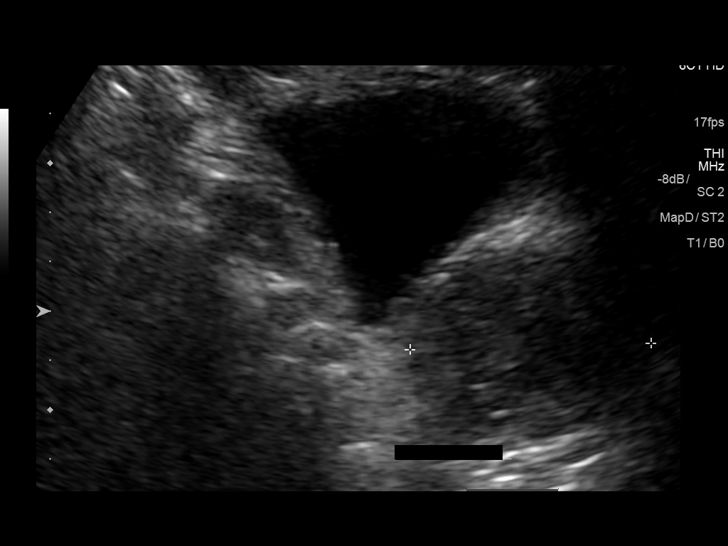
[im 35/39]
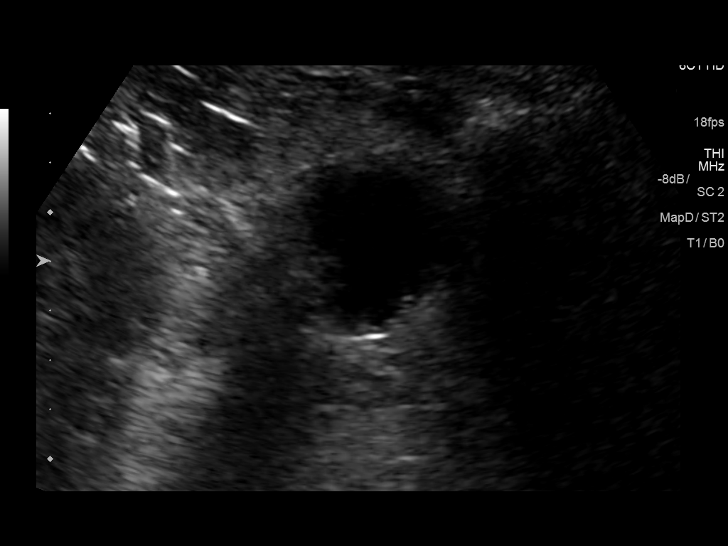
[im 39/39]
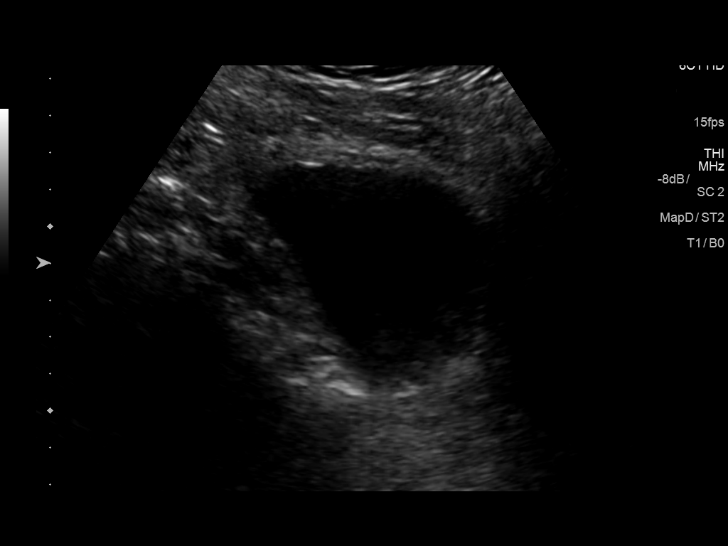

[14 of 25 positions shown; findings below may reference images not displayed]

FINDINGS: Right Kidney:

Length: 11.3 cm. Echogenicity within normal limits. No mass or
hydronephrosis visualized.

Left Kidney:

Length: 11.5 cm. Echogenicity within normal limits. No mass or
hydronephrosis visualized.

Bladder:

Appears normal for degree of bladder distention. Prostate is
prominent for size measuring 4.9 x 4.1 x 4.8 cm.

Other: Increased echogenicity in the visualized liver.
IMPRESSION: Normal appearance of both kidneys.

Prostate enlargement.

Visualized liver has increased echogenicity and suggestive for
hepatic steatosis.

## 2020-01-12 DIAGNOSIS — L570 Actinic keratosis: Secondary | ICD-10-CM | POA: Diagnosis not present

## 2020-01-12 DIAGNOSIS — Z85828 Personal history of other malignant neoplasm of skin: Secondary | ICD-10-CM | POA: Diagnosis not present

## 2020-01-12 DIAGNOSIS — L905 Scar conditions and fibrosis of skin: Secondary | ICD-10-CM | POA: Diagnosis not present

## 2020-01-12 DIAGNOSIS — L819 Disorder of pigmentation, unspecified: Secondary | ICD-10-CM | POA: Diagnosis not present

## 2020-01-18 DIAGNOSIS — N1831 Chronic kidney disease, stage 3a: Secondary | ICD-10-CM | POA: Diagnosis not present

## 2020-01-18 DIAGNOSIS — E538 Deficiency of other specified B group vitamins: Secondary | ICD-10-CM | POA: Diagnosis not present

## 2020-01-18 DIAGNOSIS — Z23 Encounter for immunization: Secondary | ICD-10-CM | POA: Diagnosis not present

## 2020-01-18 DIAGNOSIS — J449 Chronic obstructive pulmonary disease, unspecified: Secondary | ICD-10-CM | POA: Diagnosis not present

## 2020-01-18 DIAGNOSIS — L600 Ingrowing nail: Secondary | ICD-10-CM | POA: Diagnosis not present

## 2020-01-18 DIAGNOSIS — I129 Hypertensive chronic kidney disease with stage 1 through stage 4 chronic kidney disease, or unspecified chronic kidney disease: Secondary | ICD-10-CM | POA: Diagnosis not present

## 2020-01-18 DIAGNOSIS — R197 Diarrhea, unspecified: Secondary | ICD-10-CM | POA: Diagnosis not present

## 2020-01-18 DIAGNOSIS — I10 Essential (primary) hypertension: Secondary | ICD-10-CM | POA: Diagnosis not present

## 2020-01-18 DIAGNOSIS — E782 Mixed hyperlipidemia: Secondary | ICD-10-CM | POA: Diagnosis not present

## 2020-01-18 DIAGNOSIS — K219 Gastro-esophageal reflux disease without esophagitis: Secondary | ICD-10-CM | POA: Diagnosis not present

## 2020-01-18 DIAGNOSIS — E559 Vitamin D deficiency, unspecified: Secondary | ICD-10-CM | POA: Diagnosis not present

## 2020-01-18 DIAGNOSIS — E1165 Type 2 diabetes mellitus with hyperglycemia: Secondary | ICD-10-CM | POA: Diagnosis not present

## 2020-02-04 ENCOUNTER — Ambulatory Visit: Payer: PPO | Admitting: Podiatry

## 2020-02-04 ENCOUNTER — Encounter: Payer: Self-pay | Admitting: Podiatry

## 2020-02-04 ENCOUNTER — Other Ambulatory Visit: Payer: Self-pay

## 2020-02-04 DIAGNOSIS — M79675 Pain in left toe(s): Secondary | ICD-10-CM | POA: Diagnosis not present

## 2020-02-04 DIAGNOSIS — M779 Enthesopathy, unspecified: Secondary | ICD-10-CM | POA: Diagnosis not present

## 2020-02-04 DIAGNOSIS — M79674 Pain in right toe(s): Secondary | ICD-10-CM

## 2020-02-04 DIAGNOSIS — B351 Tinea unguium: Secondary | ICD-10-CM

## 2020-02-08 NOTE — Progress Notes (Signed)
Subjective:   Patient ID: Gordon Soto, male   DOB: 86 y.o.   MRN: [id number]   HPI Patient presents stating he has a painful right ankle and also is having trouble taking care of his nails they are thick and they become painful with shoe gear   ROS      Objective:  Physical Exam  Neurovascular status unchanged with patient found to have inflammation fluid around sinus tarsi right and has thick yellow brittle nailbeds 1-5 both feet     Assessment:  Inflammatory capsulitis of the sinus tarsi right along with mycotic nail infection 1-5 both feet     Plan:  H&P reviewed both conditions and today I did sterile prep injected sinus tarsi right 3 mg Dexasone Kenalog 5 mg Xylocaine debrided nailbeds 1-5 both feet with no iatrogenic bleeding and reappoint routine care

## 2020-02-10 DIAGNOSIS — M549 Dorsalgia, unspecified: Secondary | ICD-10-CM | POA: Diagnosis not present

## 2020-03-07 DIAGNOSIS — R451 Restlessness and agitation: Secondary | ICD-10-CM | POA: Diagnosis not present

## 2020-03-07 DIAGNOSIS — Z0000 Encounter for general adult medical examination without abnormal findings: Secondary | ICD-10-CM | POA: Diagnosis not present

## 2020-04-18 DIAGNOSIS — E1165 Type 2 diabetes mellitus with hyperglycemia: Secondary | ICD-10-CM | POA: Diagnosis not present

## 2020-04-18 DIAGNOSIS — E782 Mixed hyperlipidemia: Secondary | ICD-10-CM | POA: Diagnosis not present

## 2020-04-18 DIAGNOSIS — I10 Essential (primary) hypertension: Secondary | ICD-10-CM | POA: Diagnosis not present

## 2020-06-02 DIAGNOSIS — H35033 Hypertensive retinopathy, bilateral: Secondary | ICD-10-CM | POA: Diagnosis not present

## 2020-06-02 DIAGNOSIS — Z961 Presence of intraocular lens: Secondary | ICD-10-CM | POA: Diagnosis not present

## 2020-06-02 DIAGNOSIS — E119 Type 2 diabetes mellitus without complications: Secondary | ICD-10-CM | POA: Diagnosis not present

## 2020-06-02 DIAGNOSIS — H43393 Other vitreous opacities, bilateral: Secondary | ICD-10-CM | POA: Diagnosis not present

## 2020-07-11 DIAGNOSIS — L570 Actinic keratosis: Secondary | ICD-10-CM | POA: Diagnosis not present

## 2020-07-18 DIAGNOSIS — J441 Chronic obstructive pulmonary disease with (acute) exacerbation: Secondary | ICD-10-CM | POA: Diagnosis not present

## 2020-07-18 DIAGNOSIS — Z1152 Encounter for screening for COVID-19: Secondary | ICD-10-CM | POA: Diagnosis not present

## 2020-07-28 ENCOUNTER — Encounter: Payer: Self-pay | Admitting: Podiatry

## 2020-07-28 ENCOUNTER — Ambulatory Visit: Payer: PPO | Admitting: Podiatry

## 2020-07-28 ENCOUNTER — Other Ambulatory Visit: Payer: Self-pay

## 2020-07-28 ENCOUNTER — Ambulatory Visit (INDEPENDENT_AMBULATORY_CARE_PROVIDER_SITE_OTHER): Payer: PPO

## 2020-07-28 DIAGNOSIS — M79675 Pain in left toe(s): Secondary | ICD-10-CM | POA: Diagnosis not present

## 2020-07-28 DIAGNOSIS — M7751 Other enthesopathy of right foot: Secondary | ICD-10-CM

## 2020-07-28 DIAGNOSIS — M25571 Pain in right ankle and joints of right foot: Secondary | ICD-10-CM | POA: Diagnosis not present

## 2020-07-28 DIAGNOSIS — B351 Tinea unguium: Secondary | ICD-10-CM

## 2020-07-28 DIAGNOSIS — M79674 Pain in right toe(s): Secondary | ICD-10-CM

## 2020-07-28 MED ORDER — TRIAMCINOLONE ACETONIDE 10 MG/ML IJ SUSP
10.0000 mg | Freq: Once | INTRAMUSCULAR | Status: AC
  Administered 2020-07-28: 10 mg

## 2020-07-28 NOTE — Progress Notes (Signed)
Subjective:   Patient ID: Gordon Soto, male   DOB: 87 y.o.   MRN: [id number]   HPI Patient states his right ankle has started to bother him quite a bit and his nails are elongated thickened and he cannot cut them himself and they can become painful with shoe gear   ROS      Objective:  Physical Exam  Neurovascular status intact exquisite discomfort sinus tarsi right with thickened yellow brittle nailbeds 1-5 both feet incurvated and painful     Assessment:  Inflammatory capsulitis sinus tarsi right along with mycotic nail infection bilateral     Plan:  Reviewed condition sterile prep injected the sinus tarsi right 3 mg Dexasone Kenalog 5 mg Xylocaine debrided nailbeds 1-5 5 lateral no iatrogenic bleeding reappoint as needed

## 2020-08-24 DIAGNOSIS — E1165 Type 2 diabetes mellitus with hyperglycemia: Secondary | ICD-10-CM | POA: Diagnosis not present

## 2020-08-24 DIAGNOSIS — F3341 Major depressive disorder, recurrent, in partial remission: Secondary | ICD-10-CM | POA: Diagnosis not present

## 2020-08-24 DIAGNOSIS — N3941 Urge incontinence: Secondary | ICD-10-CM | POA: Diagnosis not present

## 2020-08-24 DIAGNOSIS — N1831 Chronic kidney disease, stage 3a: Secondary | ICD-10-CM | POA: Diagnosis not present

## 2020-08-24 DIAGNOSIS — N1832 Chronic kidney disease, stage 3b: Secondary | ICD-10-CM | POA: Diagnosis not present

## 2020-11-03 DIAGNOSIS — N410 Acute prostatitis: Secondary | ICD-10-CM | POA: Diagnosis not present

## 2020-12-13 DIAGNOSIS — E119 Type 2 diabetes mellitus without complications: Secondary | ICD-10-CM | POA: Diagnosis not present

## 2020-12-13 DIAGNOSIS — M5033 Other cervical disc degeneration, cervicothoracic region: Secondary | ICD-10-CM | POA: Diagnosis not present

## 2020-12-13 DIAGNOSIS — M5412 Radiculopathy, cervical region: Secondary | ICD-10-CM | POA: Diagnosis not present

## 2020-12-13 DIAGNOSIS — M25511 Pain in right shoulder: Secondary | ICD-10-CM | POA: Diagnosis not present

## 2020-12-13 DIAGNOSIS — I10 Essential (primary) hypertension: Secondary | ICD-10-CM | POA: Diagnosis not present

## 2020-12-13 DIAGNOSIS — Z87891 Personal history of nicotine dependence: Secondary | ICD-10-CM | POA: Diagnosis not present

## 2020-12-13 DIAGNOSIS — I998 Other disorder of circulatory system: Secondary | ICD-10-CM | POA: Diagnosis not present

## 2020-12-13 DIAGNOSIS — R0602 Shortness of breath: Secondary | ICD-10-CM | POA: Diagnosis not present

## 2020-12-13 DIAGNOSIS — M47812 Spondylosis without myelopathy or radiculopathy, cervical region: Secondary | ICD-10-CM | POA: Diagnosis not present

## 2020-12-15 ENCOUNTER — Encounter: Payer: Self-pay | Admitting: Podiatry

## 2020-12-15 ENCOUNTER — Ambulatory Visit: Payer: PPO | Admitting: Podiatry

## 2020-12-15 ENCOUNTER — Other Ambulatory Visit: Payer: Self-pay

## 2020-12-15 DIAGNOSIS — M7751 Other enthesopathy of right foot: Secondary | ICD-10-CM | POA: Diagnosis not present

## 2020-12-15 DIAGNOSIS — M79675 Pain in left toe(s): Secondary | ICD-10-CM

## 2020-12-15 DIAGNOSIS — M79674 Pain in right toe(s): Secondary | ICD-10-CM

## 2020-12-15 DIAGNOSIS — B351 Tinea unguium: Secondary | ICD-10-CM

## 2020-12-15 MED ORDER — TRIAMCINOLONE ACETONIDE 10 MG/ML IJ SUSP
10.0000 mg | Freq: Once | INTRAMUSCULAR | Status: AC
  Administered 2020-12-15: 10 mg

## 2020-12-15 NOTE — Progress Notes (Signed)
Subjective:   Patient ID: Gordon Soto, male   DOB: 87 y.o.   MRN: [id number]   HPI Patient presents stating that the pain in his right ankle has intensified again and is getting thick nailbeds 1-5 both feet that are sore and hard for him to wear shoe gear with and he cannot cut   ROS      Objective:  Physical Exam  Neurovascular status intact with inflammation pain of the sinus tarsi right with fluid buildup and thick and yellow brittle nailbeds 1-5 both feet that are irritated in the corners and painful     Assessment:  Inflammatory sinus tarsitis right along with mycotic nail infection 1-5 both feet with pain     Plan:  Sterile prep and injected the sinus tarsi right 3 mg Dexasone Kenalog 5 mg Xylocaine debrided nailbeds 1-5 both feet no iatrogenic bleeding reappoint routine care

## 2020-12-21 DIAGNOSIS — M47812 Spondylosis without myelopathy or radiculopathy, cervical region: Secondary | ICD-10-CM | POA: Diagnosis not present

## 2020-12-21 DIAGNOSIS — M5412 Radiculopathy, cervical region: Secondary | ICD-10-CM | POA: Diagnosis not present

## 2020-12-21 DIAGNOSIS — M5030 Other cervical disc degeneration, unspecified cervical region: Secondary | ICD-10-CM | POA: Diagnosis not present

## 2021-01-11 DIAGNOSIS — L814 Other melanin hyperpigmentation: Secondary | ICD-10-CM | POA: Diagnosis not present

## 2021-01-11 DIAGNOSIS — L821 Other seborrheic keratosis: Secondary | ICD-10-CM | POA: Diagnosis not present

## 2021-01-11 DIAGNOSIS — Z08 Encounter for follow-up examination after completed treatment for malignant neoplasm: Secondary | ICD-10-CM | POA: Diagnosis not present

## 2021-01-11 DIAGNOSIS — L570 Actinic keratosis: Secondary | ICD-10-CM | POA: Diagnosis not present

## 2021-01-11 DIAGNOSIS — C44629 Squamous cell carcinoma of skin of left upper limb, including shoulder: Secondary | ICD-10-CM | POA: Diagnosis not present

## 2021-01-11 DIAGNOSIS — D225 Melanocytic nevi of trunk: Secondary | ICD-10-CM | POA: Diagnosis not present

## 2021-01-11 DIAGNOSIS — D492 Neoplasm of unspecified behavior of bone, soft tissue, and skin: Secondary | ICD-10-CM | POA: Diagnosis not present

## 2021-01-11 DIAGNOSIS — Z85828 Personal history of other malignant neoplasm of skin: Secondary | ICD-10-CM | POA: Diagnosis not present

## 2021-01-17 DIAGNOSIS — I10 Essential (primary) hypertension: Secondary | ICD-10-CM | POA: Diagnosis not present

## 2021-01-17 DIAGNOSIS — M5412 Radiculopathy, cervical region: Secondary | ICD-10-CM | POA: Diagnosis not present

## 2021-02-21 DIAGNOSIS — R413 Other amnesia: Secondary | ICD-10-CM | POA: Diagnosis not present

## 2021-02-21 DIAGNOSIS — R0789 Other chest pain: Secondary | ICD-10-CM | POA: Diagnosis not present

## 2021-02-21 DIAGNOSIS — E1165 Type 2 diabetes mellitus with hyperglycemia: Secondary | ICD-10-CM | POA: Diagnosis not present

## 2021-02-23 DIAGNOSIS — R001 Bradycardia, unspecified: Secondary | ICD-10-CM | POA: Diagnosis not present

## 2021-02-28 DIAGNOSIS — I10 Essential (primary) hypertension: Secondary | ICD-10-CM | POA: Diagnosis not present

## 2021-02-28 DIAGNOSIS — M5412 Radiculopathy, cervical region: Secondary | ICD-10-CM | POA: Diagnosis not present

## 2021-03-03 DIAGNOSIS — S0502XA Injury of conjunctiva and corneal abrasion without foreign body, left eye, initial encounter: Secondary | ICD-10-CM | POA: Diagnosis not present

## 2021-03-03 DIAGNOSIS — M549 Dorsalgia, unspecified: Secondary | ICD-10-CM | POA: Diagnosis not present

## 2021-03-14 DIAGNOSIS — M5412 Radiculopathy, cervical region: Secondary | ICD-10-CM | POA: Diagnosis not present

## 2021-03-14 DIAGNOSIS — I10 Essential (primary) hypertension: Secondary | ICD-10-CM | POA: Diagnosis not present

## 2021-03-14 DIAGNOSIS — M5416 Radiculopathy, lumbar region: Secondary | ICD-10-CM | POA: Diagnosis not present

## 2021-03-19 DIAGNOSIS — N401 Enlarged prostate with lower urinary tract symptoms: Secondary | ICD-10-CM | POA: Diagnosis not present

## 2021-03-19 DIAGNOSIS — Z9049 Acquired absence of other specified parts of digestive tract: Secondary | ICD-10-CM | POA: Diagnosis not present

## 2021-03-19 DIAGNOSIS — N400 Enlarged prostate without lower urinary tract symptoms: Secondary | ICD-10-CM | POA: Diagnosis not present

## 2021-03-19 DIAGNOSIS — R339 Retention of urine, unspecified: Secondary | ICD-10-CM | POA: Diagnosis not present

## 2021-03-19 DIAGNOSIS — E860 Dehydration: Secondary | ICD-10-CM | POA: Diagnosis not present

## 2021-03-19 DIAGNOSIS — N289 Disorder of kidney and ureter, unspecified: Secondary | ICD-10-CM | POA: Diagnosis not present

## 2021-03-19 DIAGNOSIS — G9341 Metabolic encephalopathy: Secondary | ICD-10-CM | POA: Diagnosis not present

## 2021-03-19 DIAGNOSIS — K5730 Diverticulosis of large intestine without perforation or abscess without bleeding: Secondary | ICD-10-CM | POA: Diagnosis not present

## 2021-03-19 DIAGNOSIS — I700 Atherosclerosis of aorta: Secondary | ICD-10-CM | POA: Diagnosis not present

## 2021-03-19 DIAGNOSIS — N179 Acute kidney failure, unspecified: Secondary | ICD-10-CM | POA: Diagnosis not present

## 2021-03-20 DIAGNOSIS — G9341 Metabolic encephalopathy: Secondary | ICD-10-CM | POA: Diagnosis not present

## 2021-03-20 DIAGNOSIS — N401 Enlarged prostate with lower urinary tract symptoms: Secondary | ICD-10-CM | POA: Diagnosis not present

## 2021-03-20 DIAGNOSIS — N179 Acute kidney failure, unspecified: Secondary | ICD-10-CM | POA: Diagnosis not present

## 2021-03-22 DIAGNOSIS — N183 Chronic kidney disease, stage 3 unspecified: Secondary | ICD-10-CM | POA: Diagnosis not present

## 2021-03-28 DIAGNOSIS — Z79899 Other long term (current) drug therapy: Secondary | ICD-10-CM | POA: Diagnosis not present

## 2021-03-28 DIAGNOSIS — N401 Enlarged prostate with lower urinary tract symptoms: Secondary | ICD-10-CM | POA: Diagnosis not present

## 2021-03-28 DIAGNOSIS — R339 Retention of urine, unspecified: Secondary | ICD-10-CM | POA: Diagnosis not present

## 2021-03-29 DIAGNOSIS — M5416 Radiculopathy, lumbar region: Secondary | ICD-10-CM | POA: Diagnosis not present

## 2021-03-29 DIAGNOSIS — M545 Low back pain, unspecified: Secondary | ICD-10-CM | POA: Diagnosis not present

## 2021-04-04 DIAGNOSIS — R339 Retention of urine, unspecified: Secondary | ICD-10-CM | POA: Diagnosis not present

## 2021-04-04 DIAGNOSIS — N401 Enlarged prostate with lower urinary tract symptoms: Secondary | ICD-10-CM | POA: Diagnosis not present

## 2021-04-05 DIAGNOSIS — R972 Elevated prostate specific antigen [PSA]: Secondary | ICD-10-CM | POA: Diagnosis not present

## 2021-04-05 DIAGNOSIS — R339 Retention of urine, unspecified: Secondary | ICD-10-CM | POA: Diagnosis not present

## 2021-04-05 DIAGNOSIS — N401 Enlarged prostate with lower urinary tract symptoms: Secondary | ICD-10-CM | POA: Diagnosis not present

## 2021-04-11 DIAGNOSIS — E1165 Type 2 diabetes mellitus with hyperglycemia: Secondary | ICD-10-CM | POA: Diagnosis not present

## 2021-04-11 DIAGNOSIS — Z23 Encounter for immunization: Secondary | ICD-10-CM | POA: Diagnosis not present

## 2021-04-11 DIAGNOSIS — Z0000 Encounter for general adult medical examination without abnormal findings: Secondary | ICD-10-CM | POA: Diagnosis not present

## 2021-04-11 DIAGNOSIS — E782 Mixed hyperlipidemia: Secondary | ICD-10-CM | POA: Diagnosis not present

## 2021-04-11 DIAGNOSIS — F3341 Major depressive disorder, recurrent, in partial remission: Secondary | ICD-10-CM | POA: Diagnosis not present

## 2021-04-11 DIAGNOSIS — J449 Chronic obstructive pulmonary disease, unspecified: Secondary | ICD-10-CM | POA: Diagnosis not present

## 2021-04-11 DIAGNOSIS — K219 Gastro-esophageal reflux disease without esophagitis: Secondary | ICD-10-CM | POA: Diagnosis not present

## 2021-04-11 DIAGNOSIS — I10 Essential (primary) hypertension: Secondary | ICD-10-CM | POA: Diagnosis not present

## 2021-04-20 DIAGNOSIS — R338 Other retention of urine: Secondary | ICD-10-CM | POA: Diagnosis not present

## 2021-04-20 DIAGNOSIS — N401 Enlarged prostate with lower urinary tract symptoms: Secondary | ICD-10-CM | POA: Diagnosis not present

## 2021-07-05 ENCOUNTER — Encounter: Payer: Self-pay | Admitting: Podiatry

## 2021-07-05 ENCOUNTER — Ambulatory Visit: Payer: PPO | Admitting: Podiatry

## 2021-07-05 ENCOUNTER — Other Ambulatory Visit: Payer: Self-pay

## 2021-07-05 DIAGNOSIS — B351 Tinea unguium: Secondary | ICD-10-CM

## 2021-07-05 DIAGNOSIS — M79674 Pain in right toe(s): Secondary | ICD-10-CM

## 2021-07-05 DIAGNOSIS — M79675 Pain in left toe(s): Secondary | ICD-10-CM

## 2021-07-06 NOTE — Progress Notes (Signed)
Subjective:  ? ?Patient ID: Gordon Soto, male   DOB: 88 y.o.   MRN: [id number]  ? ?HPI ?Patient presents stating that the nails on both feet have become thickened and dystrophic and he cannot cut them himself ankle feeling better ? ? ?ROS ? ? ?   ?Objective:  ?Physical Exam  ?Neurovascular status intact thick yellow brittle nailbeds 1-5 both feet that are painful when pressed ? ?   ?Assessment:  ?Chronic mycotic nail infection 1-5 both feet with pain ? ?   ?Plan:  ?Debridement of painful nailbeds 1-5 both feet no iatrogenic bleeding noted ?   ? ? ?

## 2021-08-23 ENCOUNTER — Encounter: Payer: Self-pay | Admitting: Podiatry

## 2021-08-23 ENCOUNTER — Ambulatory Visit: Payer: PPO | Admitting: Podiatry

## 2021-08-23 DIAGNOSIS — M7751 Other enthesopathy of right foot: Secondary | ICD-10-CM | POA: Diagnosis not present

## 2021-08-23 MED ORDER — TRIAMCINOLONE ACETONIDE 10 MG/ML IJ SUSP
10.0000 mg | Freq: Once | INTRAMUSCULAR | Status: AC
  Administered 2021-08-23: 10 mg

## 2021-08-23 NOTE — Progress Notes (Signed)
Subjective:  ? ?Patient ID: Gordon Soto, male   DOB: 88 y.o.   MRN: [id number]  ? ?HPI ?Patient presents with a lot of pain in the right ankle states he needs to get it worked on ? ? ?ROS ? ? ?   ?Objective:  ?Physical Exam  ?Neurovascular status intact with exquisite discomfort in the right sinus tarsi that is not been treated for about 8 months ? ?   ?Assessment:  ?Acute sinus tarsitis right with inflammation ? ?   ?Plan:  ?Sterile prep injected the sinus tarsi right 3 mg Kenalog 5 mg Xylocaine advised on reduced activity reappoint to recheck ?   ? ? ?

## 2022-03-01 ENCOUNTER — Ambulatory Visit: Payer: PPO | Admitting: Podiatry

## 2022-03-01 ENCOUNTER — Encounter: Payer: Self-pay | Admitting: Podiatry

## 2022-03-01 DIAGNOSIS — M7751 Other enthesopathy of right foot: Secondary | ICD-10-CM

## 2022-03-01 MED ORDER — TRIAMCINOLONE ACETONIDE 10 MG/ML IJ SUSP
10.0000 mg | Freq: Once | INTRAMUSCULAR | Status: AC
  Administered 2022-03-01: 10 mg

## 2022-03-01 NOTE — Progress Notes (Signed)
Subjective:   Patient ID: Gordon Soto, male   DOB: 88 y.o.   MRN: [id number]   HPI Patient states starting to get pain in his ankle again it did well   ROS      Objective:  Physical Exam  Neuro vas status intact exquisite discomfort still noted sinus tarsi right      Assessment:  Sinus tarsitis right with inflammation     Plan:  Sterile prep injected the sinus tarsi right 3 mg Kenalog 5 mg Xylocaine and advised on reduced activity for the next few days

## 2022-03-27 ENCOUNTER — Telehealth: Payer: Self-pay

## 2022-03-27 NOTE — Telephone Encounter (Signed)
     Patient  visit on 11/19  at [facility]  was   Have you been able to follow up with your primary care physician? Yes   The patient was or was not able to obtain any needed medicine or equipment. Yes   Are there diet recommendations that you are having difficulty following?  Na   Patient expresses understanding of discharge instructions and education provided has no other needs at this time.  Yes      [place], [facility], Care Management  [phone] [address], [place], [place] [address] Phone: [phone] Email: [email or web address]

## 2022-11-12 ENCOUNTER — Ambulatory Visit (INDEPENDENT_AMBULATORY_CARE_PROVIDER_SITE_OTHER): Payer: PPO

## 2022-11-12 ENCOUNTER — Ambulatory Visit: Payer: PPO | Admitting: Podiatry

## 2022-11-12 ENCOUNTER — Encounter: Payer: Self-pay | Admitting: Podiatry

## 2022-11-12 DIAGNOSIS — M79671 Pain in right foot: Secondary | ICD-10-CM

## 2022-11-12 DIAGNOSIS — M7751 Other enthesopathy of right foot: Secondary | ICD-10-CM

## 2022-11-12 MED ORDER — TRIAMCINOLONE ACETONIDE 10 MG/ML IJ SUSP
10.0000 mg | Freq: Once | INTRAMUSCULAR | Status: AC
Start: 2022-11-12 — End: 2022-11-12
  Administered 2022-11-12: 10 mg

## 2022-11-12 NOTE — Progress Notes (Signed)
Subjective:   Patient ID: Gordon Soto, male   DOB: 89 y.o.   MRN: [id number]   HPI Patient states the ankle has started to hurt again it has been 9 months   ROS      Objective:  Physical Exam  Neurovascular status intact exquisite discomfort in the sinus tarsi right      Assessment:  Reoccurrence capsulitis sinus tarsi right     Plan:  Sterile prep injected the sinus tarsi right 3 mg Kenalog 5 mg Xylocaine reappoint to recheck as needed

## 2022-11-13 ENCOUNTER — Other Ambulatory Visit: Payer: Self-pay | Admitting: Podiatry

## 2022-11-13 DIAGNOSIS — M7751 Other enthesopathy of right foot: Secondary | ICD-10-CM

## 2022-11-13 DIAGNOSIS — M79671 Pain in right foot: Secondary | ICD-10-CM

## 2022-12-14 ENCOUNTER — Encounter (HOSPITAL_COMMUNITY): Payer: Self-pay

## 2022-12-14 NOTE — Patient Instructions (Signed)
SURGICAL WAITING ROOM VISITATION  Patients having surgery or a procedure may have no more than 2 support people in the waiting area - these visitors may rotate.    Children under the age of 71 must have an adult with them who is not the patient.   If the patient needs to stay at the hospital during part of their recovery, the visitor guidelines for inpatient rooms apply. Pre-op nurse will coordinate an appropriate time for 1 support person to accompany patient in pre-op.  This support person may not rotate.    Please refer to the [facility] website for the visitor guidelines for Inpatients (after your surgery is over and you are in a regular room).       Your procedure is scheduled on: 01-01-23   Report to [facility] Main Entrance    Report to admitting at      11:20 AM   Call this number if you have problems the morning of surgery [phone]   Do not eat food :After Midnight.   After Midnight you may have the following liquids until __10:50 AM ____  DAY OF SURGERY  THEN NOTHING BY MOUTH  Water Non-Citrus Juices (without pulp, NO RED-Apple, White grape, White cranberry) Black Coffee (NO MILK/CREAM OR CREAMERS, sugar ok)  Clear Tea (NO MILK/CREAM OR CREAMERS, sugar ok) regular and decaf                             Plain Jell-O (NO RED)                                           Fruit ices (not with fruit pulp, NO RED)                                     Popsicles (NO RED)                                                               Sports drinks like Gatorade (NO RED)                    The day of surgery:  Drink ONE (1) Pre-Surgery  G2   at   1030 AM the morning of surgery. Drink in one sitting. Do not sip.  This drink was given to you during your hospital  pre-op appointment visit. Nothing else to drink after completing the  Pre-Surgery G2   by 10:50 am then nothing by mouth.          If you have questions, please contact your surgeon's office.   FOLLOW ANY  ADDITIONAL PRE OP INSTRUCTIONS YOU RECEIVED FROM YOUR SURGEON'S OFFICE!!!     Oral Hygiene is also important to reduce your risk of infection.                                    Remember - BRUSH YOUR TEETH THE MORNING OF SURGERY WITH YOUR REGULAR TOOTHPASTE  DENTURES WILL BE REMOVED PRIOR TO SURGERY PLEASE DO NOT APPLY "Poly grip" OR ADHESIVES!!!   Do NOT smoke after Midnight   Stop all vitamins and herbal supplements 7 days before surgery.   Take these medicines the morning of surgery with A SIP OF WATER: tramadol if needed, metoprolol, Donepezil(aricept), inhaler, bring inhalers with you              DAY BEFORE SURGERY DO NOT TAKE YOUR EVENING DOSE OF GLIPIZIDE  DO NOT TAKE ANY ORAL DIABETIC MEDICATIONS DAY OF YOUR SURGERY   How to Manage Your Diabetes Before and After Surgery  Why is it important to control my blood sugar before and after surgery? Improving blood sugar levels before and after surgery helps healing and can limit problems. A way of improving blood sugar control is eating a healthy diet by:  Eating less sugar and carbohydrates  Increasing activity/exercise  Talking with your doctor about reaching your blood sugar goals High blood sugars (greater than 180 mg/dL) can raise your risk of infections and slow your recovery, so you will need to focus on controlling your diabetes during the weeks before surgery. Make sure that the doctor who takes care of your diabetes knows about your planned surgery including the date and location.  How do I manage my blood sugar before surgery? Check your blood sugar at least 4 times a day, starting 2 days before surgery, to make sure that the level is not too high or low. Check your blood sugar the morning of your surgery when you wake up and every 2 hours until you get to the Short Stay unit. If your blood sugar is less than 70 mg/dL, you will need to treat for low blood sugar: Do not take insulin. Treat a low blood sugar (less than  70 mg/dL) with  cup of clear juice (cranberry or apple), 4 glucose tablets, OR glucose gel. Recheck blood sugar in 15 minutes after treatment (to make sure it is greater than 70 mg/dL). If your blood sugar is not greater than 70 mg/dL on recheck, call [phone] for further instructions. Report your blood sugar to the short stay nurse when you get to Short Stay.  If you are admitted to the hospital after surgery: Your blood sugar will be checked by the staff and you will probably be given insulin after surgery (instead of oral diabetes medicines) to make sure you have good blood sugar levels. The goal for blood sugar control after surgery is 80-180 mg/dL.   WHAT DO I DO ABOUT MY DIABETES MEDICATION?  Do not take oral diabetes medicines (pills) the morning of surgery.   Bring CPAP mask and tubing day of surgery.                              You may not have any metal on your body including hair pins, jewelry, and body piercing             Do not wear  lotions, powders, perfumes/cologne, or deodorant                 Men may shave face and neck.   Do not bring valuables to the hospital. [facility] IS NOT             RESPONSIBLE   FOR VALUABLES.   Contacts, glasses, dentures or bridgework may not be worn into surgery.   Bring small overnight bag day  of surgery.   DO NOT BRING YOUR HOME MEDICATIONS TO THE HOSPITAL. PHARMACY WILL DISPENSE MEDICATIONS LISTED ON YOUR MEDICATION LIST TO YOU DURING YOUR ADMISSION IN THE HOSPITAL!    Patients discharged on the day of surgery will not be allowed to drive home.  Someone NEEDS to stay with you for the first 24 hours after anesthesia.   Special Instructions: Bring a copy of your healthcare power of attorney and living will documents the day of surgery if you haven't scanned them before.              Please read over the following fact sheets you were given: IF YOU HAVE QUESTIONS ABOUT YOUR PRE-OP INSTRUCTIONS PLEASE CALL  [phone]   If you received a COVID test during your pre-op visit  it is requested that you wear a mask when out in public, stay away from anyone that may not be feeling well and notify your surgeon if you develop symptoms. If you test positive for Covid or have been in contact with anyone that has tested positive in the last 10 days please notify you surgeon.      Pre-operative 5 CHG Bath Instructions   You can play a key role in reducing the risk of infection after surgery. Your skin needs to be as free of germs as possible. You can reduce the number of germs on your skin by washing with CHG (chlorhexidine gluconate) soap before surgery. CHG is an antiseptic soap that kills germs and continues to kill germs even after washing.   DO NOT use if you have an allergy to chlorhexidine/CHG or antibacterial soaps. If your skin becomes reddened or irritated, stop using the CHG and notify one of our RNs at [phone].   Please shower with the CHG soap starting 4 days before surgery using the following schedule:     Please keep in mind the following:  DO NOT shave, including legs and underarms, starting the day of your first shower.   You may shave your face at any point before/day of surgery.  Place clean sheets on your bed the day you start using CHG soap. Use a clean washcloth (not used since being washed) for each shower. DO NOT sleep with pets once you start using the CHG.   CHG Shower Instructions:  If you choose to wash your hair and private area, wash first with your normal shampoo/soap.  After you use shampoo/soap, rinse your hair and body thoroughly to remove shampoo/soap residue.  Turn the water OFF and apply about 3 tablespoons (45 ml) of CHG soap to a CLEAN washcloth.  Apply CHG soap ONLY FROM YOUR NECK DOWN TO YOUR TOES (washing for 3-5 minutes)  DO NOT use CHG soap on face, private areas, open wounds, or sores.  Pay special attention to the area where your surgery is being  performed.  If you are having back surgery, having someone wash your back for you may be helpful. Wait 2 minutes after CHG soap is applied, then you may rinse off the CHG soap.  Pat dry with a clean towel  Put on clean clothes/pajamas   If you choose to wear lotion, please use ONLY the CHG-compatible lotions on the back of this paper.     Additional instructions for the day of surgery: DO NOT APPLY any lotions, deodorants, cologne, or perfumes.   Put on clean/comfortable clothes.  Brush your teeth.  Ask your nurse before applying any prescription medications to the skin.  CHG Compatible Lotions   Aveeno Moisturizing lotion  Cetaphil Moisturizing Cream  Cetaphil Moisturizing Lotion  Clairol Herbal Essence Moisturizing Lotion, Dry Skin  Clairol Herbal Essence Moisturizing Lotion, Extra Dry Skin  Clairol Herbal Essence Moisturizing Lotion, Normal Skin  Curel Age Defying Therapeutic Moisturizing Lotion with Alpha Hydroxy  Curel Extreme Care Body Lotion  Curel Soothing Hands Moisturizing Hand Lotion  Curel Therapeutic Moisturizing Cream, Fragrance-Free  Curel Therapeutic Moisturizing Lotion, Fragrance-Free  Curel Therapeutic Moisturizing Lotion, Original Formula  Eucerin Daily Replenishing Lotion  Eucerin Dry Skin Therapy Plus Alpha Hydroxy Crme  Eucerin Dry Skin Therapy Plus Alpha Hydroxy Lotion  Eucerin Original Crme  Eucerin Original Lotion  Eucerin Plus Crme Eucerin Plus Lotion  Eucerin TriLipid Replenishing Lotion  Keri Anti-Bacterial Hand Lotion  Keri Deep Conditioning Original Lotion Dry Skin Formula Softly Scented  Keri Deep Conditioning Original Lotion, Fragrance Free Sensitive Skin Formula  Keri Lotion Fast Absorbing Fragrance Free Sensitive Skin Formula  Keri Lotion Fast Absorbing Softly Scented Dry Skin Formula  Keri Original Lotion  Keri Skin Renewal Lotion Keri Silky Smooth Lotion  Keri Silky Smooth Sensitive Skin Lotion  Nivea Body Creamy Conditioning  Oil  Nivea Body Extra Enriched Teacher, adult education Moisturizing Lotion Nivea Crme  Nivea Skin Firming Lotion  NutraDerm 30 Skin Lotion  NutraDerm Skin Lotion  NutraDerm Therapeutic Skin Cream  NutraDerm Therapeutic Skin Lotion  ProShield Protective Hand Cream  Provon moisturizing lotion   WHAT IS A BLOOD TRANSFUSION? Blood Transfusion Information  A transfusion is the replacement of blood or some of its parts. Blood is made up of multiple cells which provide different functions. Red blood cells carry oxygen and are used for blood loss replacement. White blood cells fight against infection. Platelets control bleeding. Plasma helps clot blood. Other blood products are available for specialized needs, such as hemophilia or other clotting disorders. BEFORE THE TRANSFUSION  Who gives blood for transfusions?  Healthy volunteers who are fully evaluated to make sure their blood is safe. This is blood bank blood. Transfusion therapy is the safest it has ever been in the practice of medicine. Before blood is taken from a donor, a complete history is taken to make sure that person has no history of diseases nor engages in risky social behavior (examples are intravenous drug use or sexual activity with multiple partners). The donor's travel history is screened to minimize risk of transmitting infections, such as malaria. The donated blood is tested for signs of infectious diseases, such as HIV and hepatitis. The blood is then tested to be sure it is compatible with you in order to minimize the chance of a transfusion reaction. If you or a relative donates blood, this is often done in anticipation of surgery and is not appropriate for emergency situations. It takes many days to process the donated blood. RISKS AND COMPLICATIONS Although transfusion therapy is very safe and saves many lives, the main dangers of transfusion include:  Getting an infectious  disease. Developing a transfusion reaction. This is an allergic reaction to something in the blood you were given. Every precaution is taken to prevent this. The decision to have a blood transfusion has been considered carefully by your caregiver before blood is given. Blood is not given unless the benefits outweigh the risks. AFTER THE TRANSFUSION Right after receiving a blood transfusion, you will usually feel much better and more energetic. This is especially true if your red blood cells have gotten low (anemic). The  transfusion raises the level of the red blood cells which carry oxygen, and this usually causes an energy increase. The nurse administering the transfusion will monitor you carefully for complications. HOME CARE INSTRUCTIONS  No special instructions are needed after a transfusion. You may find your energy is better. Speak with your caregiver about any limitations on activity for underlying diseases you may have. SEEK MEDICAL CARE IF:  Your condition is not improving after your transfusion. You develop redness or irritation at the intravenous (IV) site. SEEK IMMEDIATE MEDICAL CARE IF:  Any of the following symptoms occur over the next 12 hours: Shaking chills. You have a temperature by mouth above 102 F (38.9 C), not controlled by medicine. Chest, back, or muscle pain. People around you feel you are not acting correctly or are confused. Shortness of breath or difficulty breathing. Dizziness and fainting. You get a rash or develop hives. You have a decrease in urine output. Your urine turns a dark color or changes to pink, red, or brown. Any of the following symptoms occur over the next 10 days: You have a temperature by mouth above 102 F (38.9 C), not controlled by medicine. Shortness of breath. Weakness after normal activity. The white part of the eye turns yellow (jaundice). You have a decrease in the amount of urine or are urinating less often. Your urine turns a  dark color or changes to pink, red, or brown. Document Released: 04/13/2000 Document Revised: 07/09/2011 Document Reviewed: 12/01/2007 [employer] Patient Information 2014 [place], [place].  _______________________________________________________________________  Incentive Spirometer  An incentive spirometer is a tool that can help keep your lungs clear and active. This tool measures how well you are filling your lungs with each breath. Taking long deep breaths may help reverse or decrease the chance of developing breathing (pulmonary) problems (especially infection) following: A long period of time when you are unable to move or be active. BEFORE THE PROCEDURE  If the spirometer includes an indicator to show your best effort, your nurse or respiratory therapist will set it to a desired goal. If possible, sit up straight or lean slightly forward. Try not to slouch. Hold the incentive spirometer in an upright position. INSTRUCTIONS FOR USE  Sit on the edge of your bed if possible, or sit up as far as you can in bed or on a chair. Hold the incentive spirometer in an upright position. Breathe out normally. Place the mouthpiece in your mouth and seal your lips tightly around it. Breathe in slowly and as deeply as possible, raising the piston or the ball toward the top of the column. Hold your breath for 3-5 seconds or for as long as possible. Allow the piston or ball to fall to the bottom of the column. Remove the mouthpiece from your mouth and breathe out normally. Rest for a few seconds and repeat Steps 1 through 7 at least 10 times every 1-2 hours when you are awake. Take your time and take a few normal breaths between deep breaths. The spirometer may include an indicator to show your best effort. Use the indicator as a goal to work toward during each repetition. After each set of 10 deep breaths, practice coughing to be sure your lungs are clear. If you have an incision (the cut made at the time  of surgery), support your incision when coughing by placing a pillow or rolled up towels firmly against it. Once you are able to get out of bed, walk around indoors and cough well. You may  stop using the incentive spirometer when instructed by your caregiver.  RISKS AND COMPLICATIONS Take your time so you do not get dizzy or light-headed. If you are in pain, you may need to take or ask for pain medication before doing incentive spirometry. It is harder to take a deep breath if you are having pain. AFTER USE Rest and breathe slowly and easily. It can be helpful to keep track of a log of your progress. Your caregiver can provide you with a simple table to help with this. If you are using the spirometer at home, follow these instructions: SEEK MEDICAL CARE IF:  You are having difficultly using the spirometer. You have trouble using the spirometer as often as instructed. Your pain medication is not giving enough relief while using the spirometer. You develop fever of 100.5 F (38.1 C) or higher. SEEK IMMEDIATE MEDICAL CARE IF:  You cough up bloody sputum that had not been present before. You develop fever of 102 F (38.9 C) or greater. You develop worsening pain at or near the incision site. MAKE SURE YOU:  Understand these instructions. Will watch your condition. Will get help right away if you are not doing well or get worse. Document Released: 08/27/2006 Document Revised: 07/09/2011 Document Reviewed: 10/28/2006 [facility] Patient Information 2014 [place], [place].   ________________________________________________________________________

## 2022-12-14 NOTE — Progress Notes (Addendum)
PCP - Keturah Barre ,MD LOV 12-11-22 Cardiologist -   PPM/ICD -  Device Orders -  Rep Notified -   Chest x-ray - 05-02-22 CE EKG -  Stress Test -  ECHO -  Cardiac Cath -   Sleep Study -  CPAP -   Fasting Blood Sugar -  Checks Blood Sugar _____ times a day  Blood Thinner Instructions: Aspirin Instructions:  ERAS Protcol - PRE-SURGERY Ensure or G2-    COVID vaccine -  Activity-- Anesthesia review: HTN, DM, COPD,CKD stage 3  Patient denies shortness of breath, fever, cough and chest pain at PAT appointment   All instructions explained to the patient, with a verbal understanding of the material. Patient agrees to go over the instructions while at home for a better understanding. Patient also instructed to self quarantine after being tested for COVID-19. The opportunity to ask questions was provided.

## 2022-12-18 NOTE — Progress Notes (Signed)
COVID Vaccine Completed:  Date of COVID positive in last 90 days:  PCP -  Cardiologist -   Chest x-ray - 05-02-22 CEW EKG -  Stress Test -  ECHO -  Cardiac Cath -  Pacemaker/ICD device last checked: Spinal Cord Stimulator:  Bowel Prep -   Sleep Study -  CPAP -   Fasting Blood Sugar -  Checks Blood Sugar _____ times a day  Last dose of GLP1 agonist-  N/A GLP1 instructions:  N/A   Last dose of SGLT-2 inhibitors-  N/A SGLT-2 instructions: N/A   Blood Thinner Instructions:  Time Aspirin Instructions: Last Dose:  Activity level:  Can go up a flight of stairs and perform activities of daily living without stopping and without symptoms of chest pain or shortness of breath.  Able to exercise without symptoms  Unable to go up a flight of stairs without symptoms of     Anesthesia review:  COPD, CKD, DM, HTN  Patient denies shortness of breath, fever, cough and chest pain at PAT appointment  Patient verbalized understanding of instructions that were given to them at the PAT appointment. Patient was also instructed that they will need to review over the PAT instructions again at home before surgery.

## 2022-12-19 ENCOUNTER — Encounter (HOSPITAL_COMMUNITY): Payer: PPO

## 2022-12-19 ENCOUNTER — Encounter (HOSPITAL_COMMUNITY): Payer: Self-pay

## 2022-12-19 ENCOUNTER — Encounter (HOSPITAL_COMMUNITY)
Admission: RE | Admit: 2022-12-19 | Discharge: 2022-12-19 | Disposition: A | Discharge status: Home / Self Care | Payer: PPO | Source: Ambulatory Visit | Attending: Orthopedic Surgery | Admitting: Orthopedic Surgery

## 2022-12-19 ENCOUNTER — Other Ambulatory Visit: Payer: Self-pay

## 2022-12-19 VITALS — BP 156/73 | HR 81 | Temp 98.2°F | Resp 16 | Ht 72.0 in | Wt 161.0 lb

## 2022-12-19 DIAGNOSIS — I10 Essential (primary) hypertension: Secondary | ICD-10-CM | POA: Insufficient documentation

## 2022-12-19 DIAGNOSIS — E119 Type 2 diabetes mellitus without complications: Secondary | ICD-10-CM | POA: Diagnosis not present

## 2022-12-19 DIAGNOSIS — Z01818 Encounter for other preprocedural examination: Secondary | ICD-10-CM | POA: Diagnosis present

## 2022-12-19 DIAGNOSIS — M1611 Unilateral primary osteoarthritis, right hip: Secondary | ICD-10-CM

## 2022-12-19 HISTORY — DX: Chronic kidney disease, unspecified: N18.9

## 2022-12-19 HISTORY — DX: Unspecified malignant neoplasm of skin, unspecified: C44.90

## 2022-12-19 HISTORY — DX: Unspecified osteoarthritis, unspecified site: M19.90

## 2022-12-19 HISTORY — DX: Chronic obstructive pulmonary disease, unspecified: J44.9

## 2022-12-19 HISTORY — DX: Gastro-esophageal reflux disease without esophagitis: K21.9

## 2022-12-19 HISTORY — DX: Dyspnea, unspecified: R06.00

## 2022-12-19 LAB — BASIC METABOLIC PANEL
Anion gap: 10 (ref 5–15)
BUN: 18 mg/dL (ref 8–23)
CO2: 25 mmol/L (ref 22–32)
Calcium: 8.5 mg/dL — ABNORMAL LOW (ref 8.9–10.3)
Chloride: 103 mmol/L (ref 98–111)
Creatinine, Ser: 1.57 mg/dL — ABNORMAL HIGH (ref 0.61–1.24)
GFR, Estimated: 41 mL/min — ABNORMAL LOW (ref >60)
Glucose, Bld: 179 mg/dL — ABNORMAL HIGH (ref 70–99)
Potassium: 3.5 mmol/L (ref 3.5–5.1)
Sodium: 138 mmol/L (ref 135–145)

## 2022-12-19 LAB — SURGICAL PCR SCREEN
MRSA, PCR: NEGATIVE
Staphylococcus aureus: POSITIVE — AB

## 2022-12-19 LAB — HEMOGLOBIN A1C
Hgb A1c MFr Bld: 7.3 % — ABNORMAL HIGH (ref 4.8–5.6)
Mean Plasma Glucose: 162.81 mg/dL

## 2022-12-19 LAB — CBC
HCT: 38.9 % — ABNORMAL LOW (ref 39.0–52.0)
Hemoglobin: 12.6 g/dL — ABNORMAL LOW (ref 13.0–17.0)
MCH: 30.2 pg (ref 26.0–34.0)
MCHC: 32.4 g/dL (ref 30.0–36.0)
MCV: 93.3 fL (ref 80.0–100.0)
Platelets: 251 10*3/uL (ref 150–400)
RBC: 4.17 MIL/uL — ABNORMAL LOW (ref 4.22–5.81)
RDW: 12.5 % (ref 11.5–15.5)
WBC: 8.4 10*3/uL (ref 4.0–10.5)
nRBC: 0 % (ref 0.0–0.2)

## 2022-12-19 LAB — GLUCOSE, CAPILLARY: Glucose-Capillary: 180 mg/dL — ABNORMAL HIGH (ref 70–99)

## 2022-12-20 NOTE — Progress Notes (Signed)
PCR results sent to Dr. Olin to review.   

## 2022-12-21 NOTE — Anesthesia Preprocedure Evaluation (Addendum)
Anesthesia Evaluation  Patient identified by MRN, date of birth, ID band Patient awake    Reviewed: Allergy & Precautions, NPO status , Patient's Chart, lab work & pertinent test results  Airway Mallampati: I  TM Distance: >3 FB Neck ROM: Full    Dental  (+) Missing, Dental Advisory Given   Pulmonary COPD, former smoker   Pulmonary exam normal breath sounds clear to auscultation       Cardiovascular hypertension, Pt. on home beta blockers and Pt. on medications Normal cardiovascular exam Rhythm:Regular Rate:Normal     Neuro/Psych negative neurological ROS  negative psych ROS   GI/Hepatic Neg liver ROS, PUD,GERD  ,,  Endo/Other  diabetes, Type 2, Oral Hypoglycemic Agents    Renal/GU Renal InsufficiencyRenal diseaseLab Results      Component                Value               Date                      NA                       138                 12/19/2022                CL                       103                 12/19/2022                K                        3.5                 12/19/2022                CO2                      25                  12/19/2022                BUN                      18                  12/19/2022                CREATININE               1.57 (H)            12/19/2022                GFRNONAA                 41 (L)              12/19/2022                CALCIUM                  8.5 (L)             12/19/2022  GLUCOSE                  179 (H)             12/19/2022             negative genitourinary   Musculoskeletal  (+) Arthritis ,    Abdominal   Peds  Hematology negative hematology ROS (+)   Anesthesia Other Findings   Reproductive/Obstetrics                             Anesthesia Physical Anesthesia Plan  ASA: 3  Anesthesia Plan: Spinal   Post-op Pain Management: Tylenol PO (pre-op)*   Induction:   PONV Risk Score and Plan: 1  and Treatment may vary due to age or medical condition, Ondansetron, Propofol infusion and Dexamethasone  Airway Management Planned: Natural Airway  Additional Equipment:   Intra-op Plan:   Post-operative Plan:   Informed Consent: I have reviewed the patients History and Physical, chart, labs and discussed the procedure including the risks, benefits and alternatives for the proposed anesthesia with the patient or authorized representative who has indicated his/her understanding and acceptance.     Dental advisory given  Plan Discussed with: CRNA  Anesthesia Plan Comments: (See PAT note from 8/21 by K Gekas PA-C )        Anesthesia Quick Evaluation

## 2022-12-21 NOTE — Progress Notes (Signed)
Choose an anesthesia record to view details        DISCUSSION: Gordon Soto is a 89 year old male who presents to PAT prior to right hip THA on 01/01/2023 with Dr. Charlann Boxer.  Past medical history significant for former smoking, hypertension, hyperlipidemia, diabetes, CKD, COPD, GERD, arthritis  No prior anesthesia complications  Patient follows with his PCP for chronic medical issues.  He was last seen on 12/11/2022 for nasal congestion and altered mental status.  PCP noted that he was alert and oriented x 3 and the acute alteration in his mental status was "resolved secondary to resumption of prescribed medications."  He was prescribed a 10-day course of Keflex which he has completed.  Per patient and his son he is completely back to baseline.   Blood pressure noted to be mildly elevated at PAT visit.  Hemoglobin A1c was 7.3.  Creatinine is 1.57 which is consistent with his baseline. Reports SOB to PAT RN which is attributed to COPD and controlled with inhalers.  VS: BP (!) 156/73   Pulse 81   Temp 36.8 C (Oral)   Resp 16   Ht 6' (1.829 m)   Wt 73 kg   SpO2 98%   BMI 21.84 kg/m   PROVIDERS: Center, [facility]   LABS: Labs reviewed: Acceptable for surgery.  Kidney function at baseline (all labs ordered are listed, but only abnormal results are displayed)  Labs Reviewed  SURGICAL PCR SCREEN - Abnormal; Notable for the following components:      Result Value   Staphylococcus aureus POSITIVE (*)    All other components within normal limits  HEMOGLOBIN A1C - Abnormal; Notable for the following components:   Hgb A1c MFr Bld 7.3 (*)    All other components within normal limits  BASIC METABOLIC PANEL - Abnormal; Notable for the following components:   Glucose, Bld 179 (*)    Creatinine, Ser 1.57 (*)    Calcium 8.5 (*)    GFR, Estimated 41 (*)    All other components within normal limits  CBC - Abnormal; Notable for the following components:   RBC 4.17 (*)     Hemoglobin 12.6 (*)    HCT 38.9 (*)    All other components within normal limits  GLUCOSE, CAPILLARY - Abnormal; Notable for the following components:   Glucose-Capillary 180 (*)    All other components within normal limits  TYPE AND SCREEN     IMAGES:   EKG 12/19/22:  NSR, rate 74   CV:  Past Medical History:  Diagnosis Date   Arthritis    Chronic kidney disease    COPD (chronic obstructive pulmonary disease) (HCC)    Diabetes mellitus    Dyspnea    Gastric ulcer    GERD (gastroesophageal reflux disease)    Hyperlipidemia    Hypertension    Irritable bowel syndrome    Skin cancer     Past Surgical History:  Procedure Laterality Date   APPENDECTOMY     CHOLECYSTECTOMY     HERNIA REPAIR     SKIN CANCER EXCISION      MEDICATIONS:  acetaminophen (TYLENOL) 500 MG tablet   albuterol (PROAIR HFA) 108 (90 Base) MCG/ACT inhaler   albuterol (PROVENTIL) (2.5 MG/3ML) 0.083% nebulizer solution   cephALEXin (KEFLEX) 500 MG capsule   donepezil (ARICEPT) 10 MG tablet   finasteride (PROSCAR) 5 MG tablet   fluticasone (FLONASE) 50 MCG/ACT nasal spray   glipiZIDE (GLUCOTROL XL) 5 MG 24 hr tablet  glipiZIDE (GLUCOTROL) 5 MG tablet   Lancets (FREESTYLE) lancets   LORazepam (ATIVAN) 0.5 MG tablet   metoprolol succinate (TOPROL-XL) 50 MG 24 hr tablet   naproxen sodium (ALEVE) 220 MG tablet   pantoprazole (PROTONIX) 40 MG tablet   Polyethyl Glycol-Propyl Glycol (LUBRICANT EYE DROPS) 0.4-0.3 % SOLN   silodosin (RAPAFLO) 8 MG CAPS capsule   simvastatin (ZOCOR) 20 MG tablet   traMADol (ULTRAM) 50 MG tablet   No current facility-administered medications for this encounter.   Marcille Blanco [facility] Surgical Short Stay/Anesthesiology [facility] Phone [phone] 12/21/2022 11:50 AM

## 2022-12-31 ENCOUNTER — Encounter (HOSPITAL_COMMUNITY): Payer: Self-pay | Admitting: Orthopedic Surgery

## 2023-01-01 ENCOUNTER — Inpatient Hospital Stay (HOSPITAL_COMMUNITY)
Admission: RE | Admit: 2023-01-01 | Discharge: 2023-01-04 | DRG: 470 | Disposition: A | Discharge status: Home / Self Care | Payer: PPO | Source: Ambulatory Visit | Attending: Orthopedic Surgery | Admitting: Orthopedic Surgery

## 2023-01-01 ENCOUNTER — Ambulatory Visit (HOSPITAL_COMMUNITY): Payer: PPO | Admitting: Medical

## 2023-01-01 ENCOUNTER — Ambulatory Visit (HOSPITAL_COMMUNITY): Payer: PPO

## 2023-01-01 ENCOUNTER — Other Ambulatory Visit: Payer: Self-pay

## 2023-01-01 ENCOUNTER — Encounter (HOSPITAL_COMMUNITY)
Admission: RE | Disposition: A | Discharge status: Home / Self Care | Payer: Self-pay | Source: Ambulatory Visit | Attending: Orthopedic Surgery

## 2023-01-01 ENCOUNTER — Ambulatory Visit (HOSPITAL_BASED_OUTPATIENT_CLINIC_OR_DEPARTMENT_OTHER): Payer: PPO | Admitting: Anesthesiology

## 2023-01-01 ENCOUNTER — Encounter (HOSPITAL_COMMUNITY): Payer: Self-pay | Admitting: Orthopedic Surgery

## 2023-01-01 DIAGNOSIS — Z96641 Presence of right artificial hip joint: Secondary | ICD-10-CM

## 2023-01-01 DIAGNOSIS — J449 Chronic obstructive pulmonary disease, unspecified: Secondary | ICD-10-CM | POA: Diagnosis present

## 2023-01-01 DIAGNOSIS — Z79899 Other long term (current) drug therapy: Secondary | ICD-10-CM

## 2023-01-01 DIAGNOSIS — Z888 Allergy status to other drugs, medicaments and biological substances status: Secondary | ICD-10-CM

## 2023-01-01 DIAGNOSIS — M1611 Unilateral primary osteoarthritis, right hip: Secondary | ICD-10-CM | POA: Diagnosis not present

## 2023-01-01 DIAGNOSIS — Z7984 Long term (current) use of oral hypoglycemic drugs: Secondary | ICD-10-CM

## 2023-01-01 DIAGNOSIS — E1122 Type 2 diabetes mellitus with diabetic chronic kidney disease: Secondary | ICD-10-CM | POA: Diagnosis present

## 2023-01-01 DIAGNOSIS — I10 Essential (primary) hypertension: Secondary | ICD-10-CM | POA: Diagnosis not present

## 2023-01-01 DIAGNOSIS — Z833 Family history of diabetes mellitus: Secondary | ICD-10-CM

## 2023-01-01 DIAGNOSIS — K219 Gastro-esophageal reflux disease without esophagitis: Secondary | ICD-10-CM | POA: Diagnosis present

## 2023-01-01 DIAGNOSIS — N1832 Chronic kidney disease, stage 3b: Secondary | ICD-10-CM | POA: Diagnosis present

## 2023-01-01 DIAGNOSIS — Z8711 Personal history of peptic ulcer disease: Secondary | ICD-10-CM

## 2023-01-01 DIAGNOSIS — E785 Hyperlipidemia, unspecified: Secondary | ICD-10-CM | POA: Diagnosis present

## 2023-01-01 DIAGNOSIS — F0390 Unspecified dementia without behavioral disturbance: Secondary | ICD-10-CM | POA: Diagnosis present

## 2023-01-01 DIAGNOSIS — Z87891 Personal history of nicotine dependence: Secondary | ICD-10-CM | POA: Diagnosis not present

## 2023-01-01 DIAGNOSIS — I129 Hypertensive chronic kidney disease with stage 1 through stage 4 chronic kidney disease, or unspecified chronic kidney disease: Secondary | ICD-10-CM | POA: Diagnosis present

## 2023-01-01 DIAGNOSIS — Z85828 Personal history of other malignant neoplasm of skin: Secondary | ICD-10-CM

## 2023-01-01 DIAGNOSIS — E119 Type 2 diabetes mellitus without complications: Secondary | ICD-10-CM

## 2023-01-01 HISTORY — PX: TOTAL HIP ARTHROPLASTY: SHX124

## 2023-01-01 LAB — TYPE AND SCREEN
ABO/RH(D): O POS
Antibody Screen: NEGATIVE

## 2023-01-01 LAB — GLUCOSE, CAPILLARY
Glucose-Capillary: 107 mg/dL — ABNORMAL HIGH (ref 70–99)
Glucose-Capillary: 107 mg/dL — ABNORMAL HIGH (ref 70–99)

## 2023-01-01 LAB — ABO/RH: ABO/RH(D): O POS

## 2023-01-01 SURGERY — ARTHROPLASTY, HIP, TOTAL, ANTERIOR APPROACH
Anesthesia: Spinal | Site: Hip | Laterality: Right

## 2023-01-01 MED ORDER — GLIPIZIDE 5 MG PO TABS
5.0000 mg | ORAL_TABLET | Freq: Every day | ORAL | Status: DC
  Administered 2023-01-02 – 2023-01-03 (×2): 5 mg via ORAL
  Filled 2023-01-01 (×2): qty 1

## 2023-01-01 MED ORDER — ONDANSETRON HCL 4 MG/2ML IJ SOLN
4.0000 mg | Freq: Once | INTRAMUSCULAR | Status: AC
  Administered 2023-01-01: 4 mg via INTRAVENOUS
  Filled 2023-01-01: qty 2

## 2023-01-01 MED ORDER — BUPIVACAINE-EPINEPHRINE 0.25% -1:200000 IJ SOLN
INTRAMUSCULAR | Status: AC
  Filled 2023-01-01: qty 1

## 2023-01-01 MED ORDER — METOCLOPRAMIDE HCL 5 MG PO TABS: 5.0000 mg | ORAL_TABLET | Freq: Three times a day (TID) | ORAL | Status: DC | PRN

## 2023-01-01 MED ORDER — CEFAZOLIN SODIUM-DEXTROSE 2-4 GM/100ML-% IV SOLN
2.0000 g | Freq: Four times a day (QID) | INTRAVENOUS | Status: AC
  Administered 2023-01-01 – 2023-01-02 (×2): 2 g via INTRAVENOUS
  Filled 2023-01-01 (×2): qty 100

## 2023-01-01 MED ORDER — ALUM & MAG HYDROXIDE-SIMETH 200-200-20 MG/5ML PO SUSP: 30.0000 mL | ORAL | Status: DC | PRN

## 2023-01-01 MED ORDER — FLUTICASONE PROPIONATE 50 MCG/ACT NA SUSP: 1.0000 | Freq: Every day | NASAL | Status: DC | PRN

## 2023-01-01 MED ORDER — PANTOPRAZOLE SODIUM 40 MG PO TBEC
40.0000 mg | DELAYED_RELEASE_TABLET | Freq: Every day | ORAL | Status: DC
  Administered 2023-01-01 – 2023-01-04 (×4): 40 mg via ORAL
  Filled 2023-01-01 (×4): qty 1

## 2023-01-01 MED ORDER — METHOCARBAMOL 500 MG IVPB - SIMPLE MED: 500.0000 mg | Freq: Four times a day (QID) | INTRAVENOUS | Status: DC | PRN

## 2023-01-01 MED ORDER — DEXAMETHASONE SODIUM PHOSPHATE 10 MG/ML IJ SOLN: 8.0000 mg | Freq: Once | INTRAMUSCULAR | Status: DC

## 2023-01-01 MED ORDER — LIDOCAINE 2% (20 MG/ML) 5 ML SYRINGE
INTRAMUSCULAR | Status: DC | PRN
  Administered 2023-01-01: 50 mg via INTRAVENOUS

## 2023-01-01 MED ORDER — FINASTERIDE 5 MG PO TABS
5.0000 mg | ORAL_TABLET | Freq: Every day | ORAL | Status: DC
  Administered 2023-01-02 – 2023-01-04 (×3): 5 mg via ORAL
  Filled 2023-01-01 (×3): qty 1

## 2023-01-01 MED ORDER — DEXAMETHASONE SODIUM PHOSPHATE 10 MG/ML IJ SOLN
INTRAMUSCULAR | Status: DC | PRN
  Administered 2023-01-01: 4 mg via INTRAVENOUS

## 2023-01-01 MED ORDER — ACETAMINOPHEN 500 MG PO TABS
1000.0000 mg | ORAL_TABLET | Freq: Once | ORAL | Status: AC
  Administered 2023-01-01: 1000 mg via ORAL
  Filled 2023-01-01: qty 2

## 2023-01-01 MED ORDER — MENTHOL 3 MG MT LOZG: 1.0000 | LOZENGE | OROMUCOSAL | Status: DC | PRN

## 2023-01-01 MED ORDER — METOCLOPRAMIDE HCL 5 MG/ML IJ SOLN: 5.0000 mg | Freq: Three times a day (TID) | INTRAMUSCULAR | Status: DC | PRN

## 2023-01-01 MED ORDER — ALBUTEROL SULFATE (2.5 MG/3ML) 0.083% IN NEBU: 2.5000 mg | INHALATION_SOLUTION | Freq: Four times a day (QID) | RESPIRATORY_TRACT | Status: DC | PRN

## 2023-01-01 MED ORDER — SODIUM CHLORIDE (PF) 0.9 % IJ SOLN
INTRAMUSCULAR | Status: DC | PRN
  Administered 2023-01-01: 30 mL

## 2023-01-01 MED ORDER — 0.9 % SODIUM CHLORIDE (POUR BTL) OPTIME
TOPICAL | Status: DC | PRN
  Administered 2023-01-01: 1000 mL

## 2023-01-01 MED ORDER — DIPHENHYDRAMINE HCL 12.5 MG/5ML PO ELIX: 12.5000 mg | ORAL_SOLUTION | ORAL | Status: DC | PRN

## 2023-01-01 MED ORDER — SIMVASTATIN 20 MG PO TABS
20.0000 mg | ORAL_TABLET | Freq: Every evening | ORAL | Status: DC
  Administered 2023-01-01 – 2023-01-03 (×3): 20 mg via ORAL
  Filled 2023-01-01 (×3): qty 1

## 2023-01-01 MED ORDER — GLIPIZIDE ER 5 MG PO TB24: 5.0000 mg | ORAL_TABLET | Freq: Every day | ORAL | Status: DC

## 2023-01-01 MED ORDER — PROPOFOL 500 MG/50ML IV EMUL
INTRAVENOUS | Status: DC | PRN
  Administered 2023-01-01: 75 ug/kg/min via INTRAVENOUS

## 2023-01-01 MED ORDER — FAMOTIDINE IN NACL 20-0.9 MG/50ML-% IV SOLN
20.0000 mg | Freq: Once | INTRAVENOUS | Status: AC
  Administered 2023-01-01: 20 mg via INTRAVENOUS
  Filled 2023-01-01: qty 50

## 2023-01-01 MED ORDER — TRANEXAMIC ACID-NACL 1000-0.7 MG/100ML-% IV SOLN
1000.0000 mg | INTRAVENOUS | Status: AC
  Administered 2023-01-01: 1000 mg via INTRAVENOUS
  Filled 2023-01-01: qty 100

## 2023-01-01 MED ORDER — HYDROMORPHONE HCL 1 MG/ML IJ SOLN
0.5000 mg | INTRAMUSCULAR | Status: DC | PRN
  Administered 2023-01-02: 0.5 mg via INTRAVENOUS
  Filled 2023-01-01: qty 0.5

## 2023-01-01 MED ORDER — CHLORHEXIDINE GLUCONATE 0.12 % MT SOLN
15.0000 mL | Freq: Once | OROMUCOSAL | Status: AC
  Administered 2023-01-01: 15 mL via OROMUCOSAL

## 2023-01-01 MED ORDER — POVIDONE-IODINE 10 % EX SWAB: 2.0000 | Freq: Once | CUTANEOUS | Status: DC

## 2023-01-01 MED ORDER — ONDANSETRON HCL 4 MG/2ML IJ SOLN
INTRAMUSCULAR | Status: DC | PRN
  Administered 2023-01-01: 4 mg via INTRAVENOUS

## 2023-01-01 MED ORDER — SODIUM CHLORIDE (PF) 0.9 % IJ SOLN
INTRAMUSCULAR | Status: AC
  Filled 2023-01-01: qty 50

## 2023-01-01 MED ORDER — FENTANYL CITRATE PF 50 MCG/ML IJ SOSY: 25.0000 ug | PREFILLED_SYRINGE | INTRAMUSCULAR | Status: DC | PRN

## 2023-01-01 MED ORDER — ALBUTEROL SULFATE HFA 108 (90 BASE) MCG/ACT IN AERS: 1.0000 | INHALATION_SPRAY | Freq: Four times a day (QID) | RESPIRATORY_TRACT | Status: DC | PRN

## 2023-01-01 MED ORDER — BISACODYL 10 MG RE SUPP: 10.0000 mg | Freq: Every day | RECTAL | Status: DC | PRN

## 2023-01-01 MED ORDER — DEXAMETHASONE SODIUM PHOSPHATE 10 MG/ML IJ SOLN
10.0000 mg | Freq: Once | INTRAMUSCULAR | Status: AC
  Administered 2023-01-02: 10 mg via INTRAVENOUS
  Filled 2023-01-01: qty 1

## 2023-01-01 MED ORDER — PROPOFOL 10 MG/ML IV BOLUS
INTRAVENOUS | Status: DC | PRN
  Administered 2023-01-01: 20 mg via INTRAVENOUS

## 2023-01-01 MED ORDER — OXYCODONE HCL 5 MG PO TABS: 5.0000 mg | ORAL_TABLET | ORAL | Status: DC | PRN

## 2023-01-01 MED ORDER — ACETAMINOPHEN 325 MG PO TABS
650.0000 mg | ORAL_TABLET | Freq: Four times a day (QID) | ORAL | Status: DC
  Administered 2023-01-01 – 2023-01-04 (×8): 650 mg via ORAL
  Filled 2023-01-01 (×9): qty 2

## 2023-01-01 MED ORDER — ONDANSETRON HCL 4 MG PO TABS
4.0000 mg | ORAL_TABLET | Freq: Four times a day (QID) | ORAL | Status: DC | PRN
  Filled 2023-01-01: qty 1

## 2023-01-01 MED ORDER — KETOROLAC TROMETHAMINE 30 MG/ML IJ SOLN
INTRAMUSCULAR | Status: AC
  Filled 2023-01-01: qty 1

## 2023-01-01 MED ORDER — PROPOFOL 10 MG/ML IV BOLUS
INTRAVENOUS | Status: AC
  Filled 2023-01-01: qty 20

## 2023-01-01 MED ORDER — FENTANYL CITRATE (PF) 100 MCG/2ML IJ SOLN
INTRAMUSCULAR | Status: DC | PRN
  Administered 2023-01-01 (×2): 50 ug via INTRAVENOUS

## 2023-01-01 MED ORDER — POLYETHYLENE GLYCOL 3350 17 G PO PACK
17.0000 g | PACK | Freq: Two times a day (BID) | ORAL | Status: DC
  Administered 2023-01-01 – 2023-01-03 (×3): 17 g via ORAL
  Filled 2023-01-01 (×4): qty 1

## 2023-01-01 MED ORDER — BUPIVACAINE-EPINEPHRINE (PF) 0.25% -1:200000 IJ SOLN
INTRAMUSCULAR | Status: DC | PRN
  Administered 2023-01-01: 30 mL

## 2023-01-01 MED ORDER — KETOROLAC TROMETHAMINE 30 MG/ML IJ SOLN
INTRAMUSCULAR | Status: DC | PRN
  Administered 2023-01-01: 30 mg via INTRA_ARTICULAR

## 2023-01-01 MED ORDER — METHOCARBAMOL 500 MG PO TABS: 500.0000 mg | ORAL_TABLET | Freq: Four times a day (QID) | ORAL | Status: DC | PRN

## 2023-01-01 MED ORDER — LORAZEPAM 0.5 MG PO TABS
0.5000 mg | ORAL_TABLET | Freq: Every evening | ORAL | Status: DC | PRN
  Filled 2023-01-01: qty 1

## 2023-01-01 MED ORDER — SENNA 8.6 MG PO TABS
2.0000 | ORAL_TABLET | Freq: Every day | ORAL | Status: DC
  Administered 2023-01-01 – 2023-01-03 (×3): 17.2 mg via ORAL
  Filled 2023-01-01 (×3): qty 2

## 2023-01-01 MED ORDER — ONDANSETRON HCL 4 MG/2ML IJ SOLN
4.0000 mg | Freq: Four times a day (QID) | INTRAMUSCULAR | Status: DC | PRN
  Administered 2023-01-01: 4 mg via INTRAVENOUS

## 2023-01-01 MED ORDER — TAMSULOSIN HCL 0.4 MG PO CAPS
0.4000 mg | ORAL_CAPSULE | Freq: Every day | ORAL | Status: DC
  Administered 2023-01-01 – 2023-01-03 (×3): 0.4 mg via ORAL
  Filled 2023-01-01 (×3): qty 1

## 2023-01-01 MED ORDER — FENTANYL CITRATE (PF) 100 MCG/2ML IJ SOLN
INTRAMUSCULAR | Status: AC
  Filled 2023-01-01: qty 2

## 2023-01-01 MED ORDER — DONEPEZIL HCL 10 MG PO TABS
10.0000 mg | ORAL_TABLET | Freq: Every day | ORAL | Status: DC
  Administered 2023-01-02 – 2023-01-04 (×3): 10 mg via ORAL
  Filled 2023-01-01 (×3): qty 1

## 2023-01-01 MED ORDER — ONDANSETRON HCL 4 MG/2ML IJ SOLN
INTRAMUSCULAR | Status: AC
  Filled 2023-01-01: qty 2

## 2023-01-01 MED ORDER — SODIUM CHLORIDE 0.9 % IV SOLN: INTRAVENOUS | Status: DC

## 2023-01-01 MED ORDER — PHENOL 1.4 % MT LIQD: 1.0000 | OROMUCOSAL | Status: DC | PRN

## 2023-01-01 MED ORDER — CEFAZOLIN SODIUM-DEXTROSE 2-4 GM/100ML-% IV SOLN
2.0000 g | INTRAVENOUS | Status: AC
  Administered 2023-01-01: 2 g via INTRAVENOUS
  Filled 2023-01-01: qty 100

## 2023-01-01 MED ORDER — PHENYLEPHRINE HCL-NACL 20-0.9 MG/250ML-% IV SOLN
INTRAVENOUS | Status: DC | PRN
Start: 2023-01-01 — End: 2023-01-01
  Administered 2023-01-01: 20 ug/min via INTRAVENOUS

## 2023-01-01 MED ORDER — TRAMADOL HCL 50 MG PO TABS: 50.0000 mg | ORAL_TABLET | Freq: Four times a day (QID) | ORAL | Status: DC | PRN

## 2023-01-01 MED ORDER — STERILE WATER FOR IRRIGATION IR SOLN
Status: DC | PRN
  Administered 2023-01-01: 2000 mL

## 2023-01-01 MED ORDER — BUPIVACAINE IN DEXTROSE 0.75-8.25 % IT SOLN
INTRATHECAL | Status: DC | PRN
  Administered 2023-01-01: 1.8 mL via INTRATHECAL

## 2023-01-01 MED ORDER — ASPIRIN 81 MG PO CHEW
81.0000 mg | CHEWABLE_TABLET | Freq: Two times a day (BID) | ORAL | Status: DC
  Administered 2023-01-01 – 2023-01-04 (×6): 81 mg via ORAL
  Filled 2023-01-01 (×6): qty 1

## 2023-01-01 MED ORDER — LACTATED RINGERS IV SOLN: INTRAVENOUS | Status: DC

## 2023-01-01 MED ORDER — TRANEXAMIC ACID-NACL 1000-0.7 MG/100ML-% IV SOLN
1000.0000 mg | Freq: Once | INTRAVENOUS | Status: AC
  Administered 2023-01-01: 1000 mg via INTRAVENOUS
  Filled 2023-01-01: qty 100

## 2023-01-01 MED ORDER — ORAL CARE MOUTH RINSE: 15.0000 mL | Freq: Once | OROMUCOSAL | Status: AC

## 2023-01-01 MED ORDER — INSULIN ASPART 100 UNIT/ML IJ SOLN: 0.0000 [IU] | Freq: Three times a day (TID) | INTRAMUSCULAR | Status: DC

## 2023-01-01 MED ORDER — METOPROLOL SUCCINATE ER 50 MG PO TB24
50.0000 mg | ORAL_TABLET | Freq: Every day | ORAL | Status: DC
  Administered 2023-01-02 – 2023-01-04 (×3): 50 mg via ORAL
  Filled 2023-01-01 (×3): qty 1

## 2023-01-01 SURGICAL SUPPLY — 45 items
ADH SKN CLS APL DERMABOND .7 (GAUZE/BANDAGES/DRESSINGS) ×1
ARTICULEZE HEAD (Hips) ×1 IMPLANT
BAG COUNTER SPONGE SURGICOUNT (BAG) IMPLANT
BAG SPEC THK2 15X12 ZIP CLS (MISCELLANEOUS)
BAG SPNG CNTER NS LX DISP (BAG)
BAG ZIPLOCK 12X15 (MISCELLANEOUS) IMPLANT
BLADE SAG 18X100X1.27 (BLADE) ×1 IMPLANT
CATH FOLEY 2WAY 5CC 16FR (CATHETERS) ×1
CATH URTH STD 16FR FL 2W DRN (CATHETERS) IMPLANT
COVER PERINEAL POST (MISCELLANEOUS) ×1 IMPLANT
COVER SURGICAL LIGHT HANDLE (MISCELLANEOUS) ×1 IMPLANT
CUP ACETBLR 54 OD PINNACLE (Hips) IMPLANT
DERMABOND ADVANCED .7 DNX12 (GAUZE/BANDAGES/DRESSINGS) ×1 IMPLANT
DRAPE FOOT SWITCH (DRAPES) ×1 IMPLANT
DRAPE STERI IOBAN 125X83 (DRAPES) ×1 IMPLANT
DRAPE U-SHAPE 47X51 STRL (DRAPES) ×2 IMPLANT
DRESSING AQUACEL AG SP 3.5X10 (GAUZE/BANDAGES/DRESSINGS) ×1 IMPLANT
DRSG AQUACEL AG ADV 3.5X10 (GAUZE/BANDAGES/DRESSINGS) IMPLANT
DRSG AQUACEL AG SP 3.5X10 (GAUZE/BANDAGES/DRESSINGS) ×1
DURAPREP 26ML APPLICATOR (WOUND CARE) ×1 IMPLANT
ELECT REM PT RETURN 15FT ADLT (MISCELLANEOUS) ×1 IMPLANT
GLOVE BIO SURGEON STRL SZ 6 (GLOVE) ×1 IMPLANT
GLOVE BIOGEL PI IND STRL 6.5 (GLOVE) ×1 IMPLANT
GLOVE BIOGEL PI IND STRL 7.5 (GLOVE) ×1 IMPLANT
GLOVE ORTHO TXT STRL SZ7.5 (GLOVE) ×2 IMPLANT
GOWN STRL REUS W/ TWL LRG LVL3 (GOWN DISPOSABLE) ×2 IMPLANT
GOWN STRL REUS W/TWL LRG LVL3 (GOWN DISPOSABLE) ×2
HEAD ARTICULEZE (Hips) IMPLANT
HOLDER FOLEY CATH W/STRAP (MISCELLANEOUS) ×1 IMPLANT
KIT TURNOVER KIT A (KITS) IMPLANT
LINER NEUTRAL 54X36MM PLUS 4 (Hips) IMPLANT
NDL SAFETY ECLIP 18X1.5 (MISCELLANEOUS) IMPLANT
PACK ANTERIOR HIP CUSTOM (KITS) ×1 IMPLANT
SCREW 6.5MMX30MM (Screw) IMPLANT
STEM FEMORAL SZ9 HIGH ACTIS (Stem) IMPLANT
SUT MNCRL AB 4-0 PS2 18 (SUTURE) ×1 IMPLANT
SUT STRATAFIX 0 PDS 27 VIOLET (SUTURE) ×1
SUT VIC AB 1 CT1 36 (SUTURE) ×3 IMPLANT
SUT VIC AB 2-0 CT1 27 (SUTURE) ×2
SUT VIC AB 2-0 CT1 TAPERPNT 27 (SUTURE) ×2 IMPLANT
SUTURE STRATFX 0 PDS 27 VIOLET (SUTURE) ×1 IMPLANT
SYR 3ML LL SCALE MARK (SYRINGE) IMPLANT
TRAY FOLEY MTR SLVR 16FR STAT (SET/KITS/TRAYS/PACK) IMPLANT
TUBE SUCTION HIGH CAP CLEAR NV (SUCTIONS) ×1 IMPLANT
WATER STERILE IRR 1000ML POUR (IV SOLUTION) ×1 IMPLANT

## 2023-01-01 NOTE — Interval H&P Note (Signed)
History and Physical Interval Note:  01/01/2023 12:08 PM  Gordon Soto  has presented today for surgery, with the diagnosis of right hip osteoarthritis.  The various methods of treatment have been discussed with the patient and family. After consideration of risks, benefits and other options for treatment, the patient has consented to  Procedure(s): TOTAL HIP ARTHROPLASTY ANTERIOR APPROACH (Right) as a surgical intervention.  The patient's history has been reviewed, patient examined, no change in status, stable for surgery.  I have reviewed the patient's chart and labs.  Questions were answered to the patient's satisfaction.     Shelda Pal

## 2023-01-01 NOTE — Op Note (Signed)
NAME:  ECHO TOPP                ACCOUNT NO.: [id number]      MEDICAL RECORD NO.: [id number]      FACILITY:  [facility]      PHYSICIAN:  Shelda Pal  DATE OF BIRTH:  Oct 21, 1928     DATE OF PROCEDURE:  01/01/2023                                 OPERATIVE REPORT         PREOPERATIVE DIAGNOSIS: Right  hip osteoarthritis.      POSTOPERATIVE DIAGNOSIS:  Right hip osteoarthritis.      PROCEDURE:  Right total hip replacement through an anterior approach   utilizing DePuy THR system, component size 54 mm pinnacle cup, a size 36+4 neutral   Altrex liner, a size 9 Hi Actis stem with a 36+5 Articuleze metal head ball.      SURGEON:  Madlyn Frankel. Charlann Boxer, M.D.      ASSISTANT:  Rosalene Billings, PA-C     ANESTHESIA:  Spinal.      SPECIMENS:  None.      COMPLICATIONS:  None.      BLOOD LOSS:  200 cc     DRAINS:  None.      INDICATION OF THE PROCEDURE:  Gordon Soto is a 89 y.o. male who had   presented to office for evaluation of right hip pain.  Radiographs revealed   progressive degenerative changes with bone-on-bone   articulation of the  hip joint, including subchondral cystic changes and osteophytes.  The patient had painful limited range of   motion significantly affecting their overall quality of life and function.  The patient was failing to    respond to conservative measures including medications and/or injections and activity modification and at this point was ready   to proceed with more definitive measures.  Consent was obtained for   benefit of pain relief.  Specific risks of infection, DVT, component   failure, dislocation, neurovascular injury, and need for revision surgery were reviewed in the office.     PROCEDURE IN DETAIL:  The patient was brought to operative theater.   Once adequate anesthesia, preoperative antibiotics, 2 gm of Ancef, 1 gm of Tranexamic Acid, and 10 mg of Decadron were administered, the patient was positioned supine on the  [employer] table.  Once the patient was safely positioned with adequate padding of boney prominences we predraped out the hip, and used fluoroscopy to confirm orientation of the pelvis.      The right hip was then prepped and draped from proximal iliac crest to   mid thigh with a shower curtain technique.      Time-out was performed identifying the patient, planned procedure, and the appropriate extremity.     An incision was then made 2 cm lateral to the   anterior superior iliac spine extending over the orientation of the   tensor fascia lata muscle and sharp dissection was carried down to the   fascia of the muscle.      The fascia was then incised.  The muscle belly was identified and swept   laterally and retractor placed along the superior neck.  Following   cauterization of the circumflex vessels and removing some pericapsular   fat, a second cobra retractor was placed on the inferior neck.  A T-capsulotomy was made along the line of the   superior neck to the trochanteric fossa, then extended proximally and   distally.  Tag sutures were placed and the retractors were then placed   intracapsular.  We then identified the trochanteric fossa and   orientation of my neck cut and then made a neck osteotomy with the femur on traction.  The femoral   head was removed without difficulty or complication.  Traction was let   off and retractors were placed posterior and anterior around the   acetabulum.      The labrum and foveal tissue were debrided.  I began reaming with a 48 mm   reamer and reamed up to 53 mm reamer with good bony bed preparation and a 54 mm  cup was chosen.  The final 54 mm Pinnacle cup was then impacted under fluoroscopy to confirm the depth of penetration and orientation with respect to   Abduction and forward flexion.  A screw was placed into the ilium followed by the hole eliminator.  The final   36+4 neutral Altrex liner was impacted with good visualized rim fit.  The  cup was positioned anatomically within the acetabular portion of the pelvis.      At this point, the femur was rolled to 100 degrees.  Further capsule was   released off the inferior aspect of the femoral neck.  I then   released the superior capsule proximally.  With the leg in a neutral position the hook was placed laterally   along the femur under the vastus lateralis origin and elevated manually and then held in position using the hook attachment on the bed.  The leg was then extended and adducted with the leg rolled to 100   degrees of external rotation.  Retractors were placed along the medial calcar and posteriorly over the greater trochanter.  Once the proximal femur was fully   exposed, I used a box osteotome to set orientation.  I then began   broaching with the starting chili pepper broach and passed this by hand and then broached up to 9.  With the 9 broach in place I chose a high offset neck and did several trial reductions.  The offset was appropriate, leg lengths   appeared to be equal best matched with the +5 head ball trial confirmed radiographically.   Given these findings, I went ahead and dislocated the hip, repositioned all   retractors and positioned the right hip in the extended and abducted position.  The final 9 Hi Actis stem was   chosen and it was impacted down to the level of neck cut.  Based on this   and the trial reductions, a final 36+5 Articuleze metal ball was chosen and   impacted onto a clean and dry trunnion, and the hip was reduced.  The   hip had been irrigated throughout the case again at this point.  I did   reapproximate the superior capsular leaflet to the anterior leaflet   using #1 Vicryl.  The fascia of the   tensor fascia lata muscle was then reapproximated using #1 Vicryl and #0 Stratafix sutures.  The   remaining wound was closed with 2-0 Vicryl and running 4-0 Monocryl.   The hip was cleaned, dried, and dressed sterilely using Dermabond and    Aquacel dressing.  The patient was then brought   to recovery room in stable condition tolerating the procedure well.    Rosalene Billings, PA-C  was present for the entirety of the case involved from   preoperative positioning, perioperative retractor management, general   facilitation of the case, as well as primary wound closure as assistant.            Madlyn Frankel Charlann Boxer, M.D.        01/01/2023 12:09 PM

## 2023-01-01 NOTE — Anesthesia Postprocedure Evaluation (Signed)
Anesthesia Post Note  Patient: Gordon Soto  Procedure(s) Performed: TOTAL HIP ARTHROPLASTY ANTERIOR APPROACH (Right: Hip)     Patient location during evaluation: PACU Anesthesia Type: Spinal Level of consciousness: oriented and awake and alert Pain management: pain level controlled Vital Signs Assessment: post-procedure vital signs reviewed and stable Respiratory status: spontaneous breathing, respiratory function stable and patient connected to nasal cannula oxygen Cardiovascular status: blood pressure returned to baseline and stable Postop Assessment: no headache, no backache and no apparent nausea or vomiting Anesthetic complications: no  No notable events documented.  Last Vitals:  Vitals:   01/01/23 1600 01/01/23 1615  BP: (!) 162/90   Pulse: 60 (!) 58  Resp: 14 17  Temp:    SpO2: 100% 98%    Last Pain:  Vitals:   01/01/23 1615  TempSrc:   PainSc: Asleep    LLE Motor Response: No movement due to regional block (01/01/23 1615) LLE Sensation: Decreased;Numbness (01/01/23 1615) RLE Motor Response: No movement due to regional block (01/01/23 1615) RLE Sensation: Decreased;Numbness (01/01/23 1615) L Sensory Level: L2-Upper inner thigh, upper buttock (01/01/23 1615) R Sensory Level: L2-Upper inner thigh, upper buttock (01/01/23 1615)  Jonmichael Beadnell L Sayler Mickiewicz

## 2023-01-01 NOTE — Transfer of Care (Signed)
Immediate Anesthesia Transfer of Care Note  Patient: Gordon Soto  Procedure(s) Performed: Procedure(s): TOTAL HIP ARTHROPLASTY ANTERIOR APPROACH (Right)  Patient Location: PACU  Anesthesia Type:Spinal  Level of Consciousness:  sedated, patient cooperative and responds to stimulation  Airway & Oxygen Therapy:Patient Spontanous Breathing and Patient connected to face mask oxgen  Post-op Assessment:  Report given to PACU RN and Post -op Vital signs reviewed and stable  Post vital signs:  Reviewed and stable  Last Vitals:  Vitals:   01/01/23 1157 01/01/23 1206  BP: (!) 204/85 (!) 190/83  Pulse: 63   Resp: 16   Temp: 36.6 C   SpO2: 94%     Complications: No apparent anesthesia complications

## 2023-01-01 NOTE — H&P (Signed)
TOTAL HIP ADMISSION H&P  Patient is admitted for right total hip arthroplasty.  Therapy Plans: HEP Disposition: Home with son (also has sitter AM and PM) Planned DVT Prophylaxis: aspirin 81mg  BID DME needed: none PCP: Dr. Keturah Barre (son says he gave verbal clearance and said he sent) TXA: IV Allergies: none Anesthesia Concerns: none BMI: 24.6 Last HgbA1c: Updated at hopsital   Other: - tramadol/oxycodone, robaxin, tylenol - hx of dementia - on donezapil - No of VTE or cancer  - USE A COUDET CATH -- PROSTATE ISSUES   Subjective:  Chief Complaint: right hip pain  HPI: Gordon Soto, 89 y.o. male, has a history of pain and functional disability in the right hip(s) due to arthritis and patient has failed non-surgical conservative treatments for greater than 12 weeks to include NSAID's and/or analgesics and activity modification.  Onset of symptoms was gradual starting 2 years ago with gradually worsening course since that time.The patient noted no past surgery on the right hip(s).  Patient currently rates pain in the right hip at 8 out of 10 with activity. Patient has worsening of pain with activity and weight bearing, pain that interfers with activities of daily living, and pain with passive range of motion. Patient has evidence of joint space narrowing by imaging studies. This condition presents safety issues increasing the risk of falls.  There is no current active infection.  Patient Active Problem List   Diagnosis Date Noted   Pain in joint, forearm 08/21/2011   Past Medical History:  Diagnosis Date   Arthritis    Chronic kidney disease    COPD (chronic obstructive pulmonary disease) (HCC)    Diabetes mellitus    Dyspnea    Gastric ulcer    GERD (gastroesophageal reflux disease)    Hyperlipidemia    Hypertension    Irritable bowel syndrome    Skin cancer     Past Surgical History:  Procedure Laterality Date   APPENDECTOMY     CHOLECYSTECTOMY     HERNIA  REPAIR     SKIN CANCER EXCISION      No current facility-administered medications for this encounter.   Current Outpatient Medications  Medication Sig Dispense Refill Last Dose   acetaminophen (TYLENOL) 500 MG tablet Take 500-1,000 mg by mouth every 6 (six) hours as needed (pain.).      albuterol (PROAIR HFA) 108 (90 Base) MCG/ACT inhaler Inhale 1-2 puffs into the lungs every 6 (six) hours as needed for wheezing or shortness of breath.      albuterol (PROVENTIL) (2.5 MG/3ML) 0.083% nebulizer solution Take 2.5 mg by nebulization every 6 (six) hours as needed for wheezing or shortness of breath.      cephALEXin (KEFLEX) 500 MG capsule Take 500 mg by mouth in the morning, at noon, and at bedtime.      donepezil (ARICEPT) 10 MG tablet Take 10 mg by mouth in the morning.      finasteride (PROSCAR) 5 MG tablet Take 5 mg by mouth at bedtime.      fluticasone (FLONASE) 50 MCG/ACT nasal spray Place 1 spray into both nostrils daily as needed for allergies.      glipiZIDE (GLUCOTROL XL) 5 MG 24 hr tablet Take 5 mg by mouth daily with breakfast.      glipiZIDE (GLUCOTROL) 5 MG tablet Take 5 mg by mouth in the morning and at bedtime.      LORazepam (ATIVAN) 0.5 MG tablet Take 0.5 mg by mouth at bedtime as needed for  sleep.      metoprolol succinate (TOPROL-XL) 50 MG 24 hr tablet Take 50 mg by mouth in the morning. Take with or immediately following a meal.      naproxen sodium (ALEVE) 220 MG tablet Take 220 mg by mouth in the morning and at bedtime.      pantoprazole (PROTONIX) 40 MG tablet Take 40 mg by mouth at bedtime.  5    Polyethyl Glycol-Propyl Glycol (LUBRICANT EYE DROPS) 0.4-0.3 % SOLN Place 1-2 drops into both eyes 3 (three) times daily as needed (dry/irritated eyes.).      silodosin (RAPAFLO) 8 MG CAPS capsule Take 8 mg by mouth at bedtime.      simvastatin (ZOCOR) 20 MG tablet Take 20 mg by mouth every evening.      traMADol (ULTRAM) 50 MG tablet Take 50 mg by mouth 3 (three) times daily as  needed (back pain.).      Lancets (FREESTYLE) lancets 1 Units by Misc.(Non-Drug; Combo Route) route daily. Dx E11.42      Allergies  Allergen Reactions   Paxil [Paroxetine Hcl] Other (See Comments)    weak    Social History   Tobacco Use   Smoking status: Former    Current packs/day: 0.00    Types: Cigarettes    Start date: 08/20/1948    Quit date: 08/21/1963    Years since quitting: 59.4   Smokeless tobacco: Former    Quit date: 08/21/1963  Substance Use Topics   Alcohol use: No    Family History  Problem Relation Age of Onset   Diabetes Father      Review of Systems  Constitutional:  Negative for chills and fever.  Respiratory:  Negative for cough and shortness of breath.   Cardiovascular:  Negative for chest pain.  Gastrointestinal:  Negative for nausea and vomiting.  Musculoskeletal:  Positive for arthralgias.     Objective:  Physical Exam Well nourished and well developed. General: Alert and oriented x3, cooperative and pleasant, no acute distress. Head: normocephalic, atraumatic, neck supple. Eyes: EOMI.  Musculoskeletal: Right hip exam: He has noted to have painful and limited hip flexion internal rotation with pain reproduced both anterior and lateral Mild tenderness to palpation around the hip Slight external rotation contracture with active hip flexion with 5 -/5 strength Whereas his left hip does have some limitations with regards to passive range of motion but is not as painful as his right hip. No lower extremity edema, erythema or calf tenderness  Calves soft and nontender. Motor function intact in LE. Strength 5/5 LE bilaterally. Neuro: Distal pulses 2+. Sensation to light touch intact in LE.  Vital signs in last 24 hours:    Labs:   Estimated body mass index is 21.84 kg/m as calculated from the following:   Height as of 12/19/22: 6' (1.829 m).   Weight as of 12/19/22: 73 kg.   Imaging Review Plain radiographs demonstrate severe  degenerative joint disease of the right hip(s). The bone quality appears to be adequate for age and reported activity level.      Assessment/Plan:  End stage arthritis, right hip(s)  The patient history, physical examination, clinical judgement of the provider and imaging studies are consistent with end stage degenerative joint disease of the right hip(s) and total hip arthroplasty is deemed medically necessary. The treatment options including medical management, injection therapy, arthroscopy and arthroplasty were discussed at length. The risks and benefits of total hip arthroplasty were presented and reviewed. The risks due to  aseptic loosening, infection, stiffness, dislocation/subluxation,  thromboembolic complications and other imponderables were discussed.  The patient acknowledged the explanation, agreed to proceed with the plan and consent was signed. Patient is being admitted for inpatient treatment for surgery, pain control, PT, OT, prophylactic antibiotics, VTE prophylaxis, progressive ambulation and ADL's and discharge planning.The patient is planning to be discharged  home.   Rosalene Billings, PA-C Orthopedic Surgery [facility] [place] [phone]

## 2023-01-01 NOTE — Discharge Instructions (Signed)

## 2023-01-01 NOTE — Anesthesia Procedure Notes (Signed)
Spinal  Patient location during procedure: OR Start time: 01/01/2023 1:40 PM End time: 01/01/2023 1:43 PM Reason for block: surgical anesthesia Staffing Performed: anesthesiologist  Anesthesiologist: Elmer Picker, MD Performed by: Elmer Picker, MD Authorized by: Elmer Picker, MD   Preanesthetic Checklist Completed: patient identified, IV checked, risks and benefits discussed, surgical consent, monitors and equipment checked, pre-op evaluation and timeout performed Spinal Block Patient position: right lateral decubitus Prep: DuraPrep and site prepped and draped Patient monitoring: cardiac monitor, continuous pulse ox and blood pressure Approach: midline Location: L3-4 Injection technique: single-shot Needle Needle type: Pencan  Needle gauge: 24 G Needle length: 9 cm Assessment Sensory level: T6 Events: CSF return Additional Notes Functioning IV was confirmed and monitors were applied. Sterile prep and drape, including hand hygiene and sterile gloves were used. The patient was positioned and the spine was prepped. The skin was anesthetized with lidocaine.  Free flow of clear CSF was obtained prior to injecting local anesthetic into the CSF.  The spinal needle aspirated freely following injection.  The needle was carefully withdrawn.  The patient tolerated the procedure well.

## 2023-01-02 ENCOUNTER — Encounter (HOSPITAL_COMMUNITY): Payer: Self-pay | Admitting: Orthopedic Surgery

## 2023-01-02 DIAGNOSIS — Z888 Allergy status to other drugs, medicaments and biological substances status: Secondary | ICD-10-CM | POA: Diagnosis not present

## 2023-01-02 DIAGNOSIS — J449 Chronic obstructive pulmonary disease, unspecified: Secondary | ICD-10-CM | POA: Diagnosis present

## 2023-01-02 DIAGNOSIS — K219 Gastro-esophageal reflux disease without esophagitis: Secondary | ICD-10-CM | POA: Diagnosis present

## 2023-01-02 DIAGNOSIS — Z79899 Other long term (current) drug therapy: Secondary | ICD-10-CM | POA: Diagnosis not present

## 2023-01-02 DIAGNOSIS — N1832 Chronic kidney disease, stage 3b: Secondary | ICD-10-CM | POA: Diagnosis present

## 2023-01-02 DIAGNOSIS — Z833 Family history of diabetes mellitus: Secondary | ICD-10-CM | POA: Diagnosis not present

## 2023-01-02 DIAGNOSIS — Z87891 Personal history of nicotine dependence: Secondary | ICD-10-CM | POA: Diagnosis not present

## 2023-01-02 DIAGNOSIS — Z8711 Personal history of peptic ulcer disease: Secondary | ICD-10-CM | POA: Diagnosis not present

## 2023-01-02 DIAGNOSIS — F0390 Unspecified dementia without behavioral disturbance: Secondary | ICD-10-CM | POA: Diagnosis present

## 2023-01-02 DIAGNOSIS — I129 Hypertensive chronic kidney disease with stage 1 through stage 4 chronic kidney disease, or unspecified chronic kidney disease: Secondary | ICD-10-CM | POA: Diagnosis present

## 2023-01-02 DIAGNOSIS — Z7984 Long term (current) use of oral hypoglycemic drugs: Secondary | ICD-10-CM | POA: Diagnosis not present

## 2023-01-02 DIAGNOSIS — E1122 Type 2 diabetes mellitus with diabetic chronic kidney disease: Secondary | ICD-10-CM | POA: Diagnosis present

## 2023-01-02 DIAGNOSIS — M1611 Unilateral primary osteoarthritis, right hip: Secondary | ICD-10-CM | POA: Diagnosis present

## 2023-01-02 DIAGNOSIS — Z85828 Personal history of other malignant neoplasm of skin: Secondary | ICD-10-CM | POA: Diagnosis not present

## 2023-01-02 DIAGNOSIS — E785 Hyperlipidemia, unspecified: Secondary | ICD-10-CM | POA: Diagnosis present

## 2023-01-02 LAB — CBC
HCT: 32.2 % — ABNORMAL LOW (ref 39.0–52.0)
Hemoglobin: 11 g/dL — ABNORMAL LOW (ref 13.0–17.0)
MCH: 31.3 pg (ref 26.0–34.0)
MCHC: 34.2 g/dL (ref 30.0–36.0)
MCV: 91.5 fL (ref 80.0–100.0)
Platelets: 221 10*3/uL (ref 150–400)
RBC: 3.52 MIL/uL — ABNORMAL LOW (ref 4.22–5.81)
RDW: 12.5 % (ref 11.5–15.5)
WBC: 10.9 10*3/uL — ABNORMAL HIGH (ref 4.0–10.5)
nRBC: 0 % (ref 0.0–0.2)

## 2023-01-02 LAB — BASIC METABOLIC PANEL
Anion gap: 8 (ref 5–15)
BUN: 16 mg/dL (ref 8–23)
CO2: 26 mmol/L (ref 22–32)
Calcium: 8.1 mg/dL — ABNORMAL LOW (ref 8.9–10.3)
Chloride: 102 mmol/L (ref 98–111)
Creatinine, Ser: 1.61 mg/dL — ABNORMAL HIGH (ref 0.61–1.24)
GFR, Estimated: 40 mL/min — ABNORMAL LOW (ref >60)
Glucose, Bld: 107 mg/dL — ABNORMAL HIGH (ref 70–99)
Potassium: 2.9 mmol/L — ABNORMAL LOW (ref 3.5–5.1)
Sodium: 136 mmol/L (ref 135–145)

## 2023-01-02 LAB — GLUCOSE, CAPILLARY
Glucose-Capillary: 103 mg/dL — ABNORMAL HIGH (ref 70–99)
Glucose-Capillary: 115 mg/dL — ABNORMAL HIGH (ref 70–99)
Glucose-Capillary: 140 mg/dL — ABNORMAL HIGH (ref 70–99)
Glucose-Capillary: 105 mg/dL — ABNORMAL HIGH (ref 70–99)

## 2023-01-02 MED ORDER — POTASSIUM CHLORIDE CRYS ER 20 MEQ PO TBCR
20.0000 meq | EXTENDED_RELEASE_TABLET | Freq: Two times a day (BID) | ORAL | Status: DC
  Administered 2023-01-02 – 2023-01-04 (×5): 20 meq via ORAL
  Filled 2023-01-02 (×5): qty 1

## 2023-01-02 NOTE — Progress Notes (Signed)
Orthopedic Tech Progress Note Patient Details:  Gordon Soto [birth year] [id number]  Patient ID: Gordon Soto, male   DOB: January 11, 1929, 89 y.o.   MRN: [id number] Pt.exceeds age limit for overhead trapeze. Tonye Pearson 01/02/2023, 2:32 PM

## 2023-01-02 NOTE — Progress Notes (Signed)
   Subjective: 1 Day Post-Op Procedure(s) (LRB): TOTAL HIP ARTHROPLASTY ANTERIOR APPROACH (Right) Patient reports pain as mild.   Patient seen in rounds by Dr. Charlann Boxer. Patient is resting in bed on exam this morning. No acute events overnight. Foley catheter removed. Patient has not been up with PT yet.  We will start therapy today.   Objective: Vital signs in last 24 hours: Temp:  [97.4 F (36.3 C)-98.9 F (37.2 C)] 98.9 F (37.2 C) (09/04 0619) Pulse Rate:  [57-91] 91 (09/04 0619) Resp:  [11-18] 16 (09/04 0619) BP: (128-204)/(54-90) 153/78 (09/04 0619) SpO2:  [90 %-100 %] 90 % (09/04 0619) Weight:  [73 kg] 73 kg (09/03 1212)  Intake/Output from previous day:  Intake/Output Summary (Last 24 hours) at 01/02/2023 0827 Last data filed at 01/02/2023 0619 Gross per 24 hour  Intake 2401.24 ml  Output 2175 ml  Net 226.24 ml     Intake/Output this shift: No intake/output data recorded.  Labs: Recent Labs    01/02/23 0717  HGB 11.0*   Recent Labs    01/02/23 0717  WBC 10.9*  RBC 3.52*  HCT 32.2*  PLT 221   Recent Labs    01/02/23 0717  NA 136  K 2.9*  CL 102  CO2 26  BUN 16  CREATININE 1.61*  GLUCOSE 107*  CALCIUM 8.1*   No results for input(s): "LABPT", "INR" in the last 72 hours.  Exam: General - Patient is Alert and Appropriate Extremity - Neurologically intact Sensation intact distally Intact pulses distally Dorsiflexion/Plantar flexion intact Dressing - dressing C/D/I Motor Function - intact, moving foot and toes well on exam.   Past Medical History:  Diagnosis Date   Arthritis    Chronic kidney disease    COPD (chronic obstructive pulmonary disease) (HCC)    Diabetes mellitus    Dyspnea    Gastric ulcer    GERD (gastroesophageal reflux disease)    Hyperlipidemia    Hypertension    Irritable bowel syndrome    Skin cancer     Assessment/Plan: 1 Day Post-Op Procedure(s) (LRB): TOTAL HIP ARTHROPLASTY ANTERIOR APPROACH (Right) Principal  Problem:   S/P total right hip arthroplasty  Estimated body mass index is 21.84 kg/m as calculated from the following:   Height as of this encounter: 6' (1.829 m).   Weight as of this encounter: 73 kg. Advance diet Up with therapy  DVT Prophylaxis - Aspirin Weight bearing as tolerated.  Hgb stable at 11 this AM. K 2.9 > will give kdur today Cr 1.61 (near baseline)  Up with PT today  Likely will require another day here due to underlying dementia and baseline function   Rosalene Billings, PA-C Orthopedic Surgery [phone] 01/02/2023, 8:27 AM

## 2023-01-02 NOTE — TOC Transition Note (Signed)
Transition of Care [facility]) - CM/SW Discharge Note   Patient Details  Name: Gordon Soto MRN: [id number] Date of Birth: Jul 13, 1928  Transition of Care [facility]) CM/SW Contact:  Amada Jupiter, LCSW Phone Number: 01/03/2023, 3:59 PM   Clinical Narrative:     ADDENDUM 9/5:  HHPT is recommended/ ordered and no agency preference per family.  HHPT set up with [facility].  Agency info placed on AVS.  Met with pt today who is poorly oriented - is oriented to self and says he had "replacement" but then only vague answers.  Contacted pt's son, Gordon Soto, to confirm support plan at home as pt lives alone.  Son reports that they have arranged private care for mornings and evenings and that either he or pt's daughter will be with pt at the other times.  Stressed to son several times that pt will not be safe if left alone and he states he understands this. Pt has needed DME in the home.  No further TOC needs at this time but will continue to monitor if new needs arise.  Final next level of care: Home/Self Care Barriers to Discharge: Continued Medical Work up   Patient Goals and CMS Choice      Discharge Placement                         Discharge Plan and Services Additional resources added to the After Visit Summary for                  DME Arranged: N/A DME Agency: NA       HH Arranged: PT HH Agency: [facility] Date HH Agency Contacted: 01/03/23 Time HH Agency Contacted: 1100 Representative spoke with at [facility] Agency: Haywood Lasso  Social Determinants of Health (SDOH) Interventions SDOH Screenings   Food Insecurity: No Food Insecurity (01/01/2023)  Housing: Low Risk  (01/01/2023)  Transportation Needs: No Transportation Needs (01/01/2023)  Utilities: Not At Risk (01/01/2023)  Tobacco Use: Medium Risk (01/01/2023)     Readmission Risk Interventions     No data to display

## 2023-01-02 NOTE — Progress Notes (Signed)
Physical Therapy Treatment Patient Details Name: Gordon Soto MRN: [id number] DOB: 1929/03/27 Today's Date: 01/02/2023   History of Present Illness 89 y.o. male admitted 01/01/23 for R THA-AA. PMH: DM, HTN, CKD, IBS    PT Comments  Pt tolerated increased ambulation distance of 25' with RW, distance limited by pain/fatigue. Son present and stated family plans to bring in a sitter to stay with pt at home.     If plan is discharge home, recommend the following: A lot of help with walking and/or transfers;A lot of help with bathing/dressing/bathroom;Assistance with cooking/housework;Assist for transportation;Help with stairs or ramp for entrance;Direct supervision/assist for medications management;Direct supervision/assist for financial management   Can travel by private vehicle        Equipment Recommendations  Rolling walker (2 wheels)    Recommendations for Other Services       Precautions / Restrictions Precautions Precautions: Fall Precaution Comments: pt reports he's had falls at home Restrictions Weight Bearing Restrictions: No     Mobility  Bed Mobility Overal bed mobility: Needs Assistance Bed Mobility: Sit to Supine     Supine to sit: Max assist Sit to supine: Mod assist   General bed mobility comments: mod A for BLEs into bed    Transfers Overall transfer level: Needs assistance Equipment used: Rolling walker (2 wheels) Transfers: Sit to/from Stand Sit to Stand: Mod assist           General transfer comment: assist to power up, VCs hand placement    Ambulation/Gait Ambulation/Gait assistance: Contact guard assist Gait Distance (Feet): 25 Feet Assistive device: Rolling walker (2 wheels) Gait Pattern/deviations: Step-to pattern, Decreased step length - right, Decreased step length - left, Trunk flexed Gait velocity: decr     General Gait Details: distance limited by pain/fatigue, VCs for positioning in RW   Stairs             Wheelchair  Mobility     Tilt Bed    Modified Rankin (Stroke Patients Only)       Balance Overall balance assessment: Needs assistance Sitting-balance support: Feet supported, Single extremity supported Sitting balance-Leahy Scale: Fair     Standing balance support: Bilateral upper extremity supported, During functional activity, Reliant on assistive device for balance Standing balance-Leahy Scale: Poor                              Cognition Arousal: Alert Behavior During Therapy: WFL for tasks assessed/performed Overall Cognitive Status: No family/caregiver present to determine baseline cognitive functioning                                 General Comments: pt oriented to self only        Exercises Total Joint Exercises Ankle Circles/Pumps: AROM, Both, 10 reps, Supine Heel Slides: AAROM, Right, 5 reps, Supine Hip ABduction/ADduction: AAROM, Right, 5 reps, Supine Long Arc Quad: AROM, Right, 5 reps, Seated    General Comments        Pertinent Vitals/Pain Pain Assessment Pain Assessment: Faces Faces Pain Scale: Hurts little more Pain Location: R hip with movement Pain Descriptors / Indicators: Grimacing, Operative site guarding Pain Intervention(s): Limited activity within patient's tolerance, Monitored during session, Repositioned    Home Living Family/patient expects to be discharged to:: Private residence Living Arrangements: Alone  Additional Comments: pt oriented to self only, stated he lives alone, that he's had recent falls, son gets groceries for him; pt not able to provide details home inforamation 2* confusion; per [facility] family to bring in private help for pt    Prior Function            PT Goals (current goals can now be found in the care plan section) Acute Rehab PT Goals PT Goal Formulation: Patient unable to participate in goal setting Time For Goal Achievement: 01/16/23 Potential to Achieve Goals:  Good Progress towards PT goals: Progressing toward goals    Frequency    7X/week      PT Plan      Co-evaluation              AM-PAC PT "6 Clicks" Mobility   Outcome Measure  Help needed turning from your back to your side while in a flat bed without using bedrails?: A Lot Help needed moving from lying on your back to sitting on the side of a flat bed without using bedrails?: A Lot Help needed moving to and from a bed to a chair (including a wheelchair)?: A Lot Help needed standing up from a chair using your arms (e.g., wheelchair or bedside chair)?: A Lot Help needed to walk in hospital room?: A Little Help needed climbing 3-5 steps with a railing? : Total 6 Click Score: 12    End of Session Equipment Utilized During Treatment: Gait belt Activity Tolerance: Patient tolerated treatment well Patient left: in chair;with call bell/phone within reach;with chair alarm set Nurse Communication: Mobility status PT Visit Diagnosis: Difficulty in walking, not elsewhere classified (R26.2);Muscle weakness (generalized) (M62.81);Other abnormalities of gait and mobility (R26.89);Unsteadiness on feet (R26.81);History of falling (Z91.81);Pain Pain - Right/Left: Right Pain - part of body: Hip     Time: 8295-6213 PT Time Calculation (min) (ACUTE ONLY): 13 min  Charges:    $Gait Training: 8-22 mins PT General Charges $$ ACUTE PT VISIT: 1 Visit                    Tamala Ser PT 01/02/2023  Acute Rehabilitation Services  Office [phone]

## 2023-01-02 NOTE — Evaluation (Signed)
Physical Therapy Evaluation Patient Details Name: Gordon Soto MRN: [id number] DOB: 01-13-1929 Today's Date: 01/02/2023  History of Present Illness  89 y.o. male admitted 01/01/23 for R THA-AA. PMH: DM, HTN, CKD, IBS  Clinical Impression  Pt admitted with above diagnosis. Pt ambulated 15' with RW, distance limited by R hip pain and fatigue. Max assist for supine to sit. Pt oriented to self only so was not able to provide reliable prior level of function nor home set up information. 24 hour assist recommended following acute DC, and a RW if he does not already have one.  Pt currently with functional limitations due to the deficits listed below (see PT Problem List). Pt will benefit from acute skilled PT to increase their independence and safety with mobility to allow discharge.           If plan is discharge home, recommend the following: A lot of help with walking and/or transfers;A lot of help with bathing/dressing/bathroom;Assistance with cooking/housework;Assist for transportation;Help with stairs or ramp for entrance;Direct supervision/assist for medications management;Direct supervision/assist for financial management   Can travel by private vehicle        Equipment Recommendations Rolling walker (2 wheels)  Recommendations for Other Services       Functional Status Assessment Patient has had a recent decline in their functional status and demonstrates the ability to make significant improvements in function in a reasonable and predictable amount of time.     Precautions / Restrictions Precautions Precautions: Fall Precaution Comments: pt reports he's had falls at home Restrictions Weight Bearing Restrictions: No      Mobility  Bed Mobility Overal bed mobility: Needs Assistance Bed Mobility: Supine to Sit     Supine to sit: Max assist     General bed mobility comments: assist to raise trunk, pivot hips and advance BLEs to EOB    Transfers Overall transfer level:  Needs assistance Equipment used: Rolling walker (2 wheels) Transfers: Sit to/from Stand Sit to Stand: Mod assist           General transfer comment: assist to power up, pt pulled up on RW with BUEs    Ambulation/Gait Ambulation/Gait assistance: Contact guard assist Gait Distance (Feet): 15 Feet Assistive device: Rolling walker (2 wheels) Gait Pattern/deviations: Step-to pattern, Decreased step length - right, Decreased step length - left, Trunk flexed       General Gait Details: distance limited by pain/fatigue  Stairs            Wheelchair Mobility     Tilt Bed    Modified Rankin (Stroke Patients Only)       Balance Overall balance assessment: Needs assistance Sitting-balance support: Feet supported, Single extremity supported Sitting balance-Leahy Scale: Fair     Standing balance support: Bilateral upper extremity supported, During functional activity, Reliant on assistive device for balance Standing balance-Leahy Scale: Poor                               Pertinent Vitals/Pain Pain Assessment Pain Assessment: Faces Faces Pain Scale: Hurts even more Pain Location: R hip with movement Pain Descriptors / Indicators: Grimacing, Operative site guarding Pain Intervention(s): Limited activity within patient's tolerance, Monitored during session, Repositioned, Premedicated before session    Home Living Family/patient expects to be discharged to:: Private residence Living Arrangements: Alone                 Additional Comments: pt oriented to self  only, stated he lives alone, that he's had recent falls, son gets groceries for him; pt not able to provide details home inforamation 2* confusion; per [facility] family to bring in private help for pt    Prior Function Prior Level of Function : Patient poor historian/Family not available;History of Falls (last six months)             Mobility Comments: unknown, pt poor historian        Extremity/Trunk Assessment   Upper Extremity Assessment Upper Extremity Assessment: Overall WFL for tasks assessed    Lower Extremity Assessment Lower Extremity Assessment: RLE deficits/detail RLE Deficits / Details: knee ext at least 3/5, hip flexion AAROM ~30* limited by pain RLE Sensation: WNL    Cervical / Trunk Assessment Cervical / Trunk Assessment: Kyphotic  Communication   Communication Communication: No apparent difficulties  Cognition Arousal: Alert Behavior During Therapy: WFL for tasks assessed/performed Overall Cognitive Status: No family/caregiver present to determine baseline cognitive functioning                                 General Comments: pt oriented to self only        General Comments      Exercises Total Joint Exercises Ankle Circles/Pumps: AROM, Both, 10 reps, Supine Heel Slides: AAROM, Right, 5 reps, Supine Hip ABduction/ADduction: AAROM, Right, 5 reps, Supine Long Arc Quad: AROM, Right, 5 reps, Seated   Assessment/Plan    PT Assessment Patient needs continued PT services  PT Problem List Decreased strength;Decreased activity tolerance;Decreased balance;Pain;Decreased mobility       PT Treatment Interventions DME instruction;Functional mobility training;Therapeutic activities;Therapeutic exercise;Balance training;Gait training    PT Goals (Current goals can be found in the Care Plan section)  Acute Rehab PT Goals PT Goal Formulation: Patient unable to participate in goal setting Time For Goal Achievement: 01/16/23 Potential to Achieve Goals: Good    Frequency 7X/week     Co-evaluation               AM-PAC PT "6 Clicks" Mobility  Outcome Measure Help needed turning from your back to your side while in a flat bed without using bedrails?: A Lot Help needed moving from lying on your back to sitting on the side of a flat bed without using bedrails?: Total Help needed moving to and from a bed to a chair  (including a wheelchair)?: A Lot Help needed standing up from a chair using your arms (e.g., wheelchair or bedside chair)?: A Lot Help needed to walk in hospital room?: A Little Help needed climbing 3-5 steps with a railing? : Total 6 Click Score: 11    End of Session Equipment Utilized During Treatment: Gait belt Activity Tolerance: Patient tolerated treatment well Patient left: in chair;with call bell/phone within reach;with chair alarm set Nurse Communication: Mobility status PT Visit Diagnosis: Difficulty in walking, not elsewhere classified (R26.2);Muscle weakness (generalized) (M62.81);Other abnormalities of gait and mobility (R26.89);Unsteadiness on feet (R26.81);History of falling (Z91.81);Pain Pain - Right/Left: Right Pain - part of body: Hip    Time: 1207-1226 PT Time Calculation (min) (ACUTE ONLY): 19 min   Charges:   PT Evaluation $PT Eval Moderate Complexity: 1 Mod   PT General Charges $$ ACUTE PT VISIT: 1 Visit        Tamala Ser PT 01/02/2023  Acute Rehabilitation Services  Office [phone]

## 2023-01-02 NOTE — Progress Notes (Signed)
PT Cancellation Note  Patient Details Name: Gordon Soto MRN: [id number] DOB: Apr 21, 1929   Cancelled Treatment:    Reason Eval/Treat Not Completed: Other (comment) (per RN, pt is resistant to care this morning, will check back later today.)  Tamala Ser PT 01/02/2023  Acute Rehabilitation Services  Office [phone]

## 2023-01-02 NOTE — Care Management Obs Status (Signed)
MEDICARE OBSERVATION STATUS NOTIFICATION   Patient Details  Name: Gordon Soto MRN: [id number] Date of Birth: [birth year]   Medicare Observation Status Notification Given:  Yes    Amada Jupiter, LCSW 01/02/2023, 2:32 PM

## 2023-01-03 LAB — GLUCOSE, CAPILLARY
Glucose-Capillary: 76 mg/dL (ref 70–99)
Glucose-Capillary: 52 mg/dL — ABNORMAL LOW (ref 70–99)
Glucose-Capillary: 80 mg/dL (ref 70–99)
Glucose-Capillary: 102 mg/dL — ABNORMAL HIGH (ref 70–99)
Glucose-Capillary: 66 mg/dL — ABNORMAL LOW (ref 70–99)

## 2023-01-03 LAB — CBC
HCT: 34.5 % — ABNORMAL LOW (ref 39.0–52.0)
Hemoglobin: 11.4 g/dL — ABNORMAL LOW (ref 13.0–17.0)
MCH: 31.1 pg (ref 26.0–34.0)
MCHC: 33 g/dL (ref 30.0–36.0)
MCV: 94.3 fL (ref 80.0–100.0)
Platelets: 212 10*3/uL (ref 150–400)
RBC: 3.66 MIL/uL — ABNORMAL LOW (ref 4.22–5.81)
RDW: 12.7 % (ref 11.5–15.5)
WBC: 8.7 10*3/uL (ref 4.0–10.5)
nRBC: 0 % (ref 0.0–0.2)

## 2023-01-03 LAB — BASIC METABOLIC PANEL
Anion gap: 9 (ref 5–15)
BUN: 18 mg/dL (ref 8–23)
CO2: 28 mmol/L (ref 22–32)
Calcium: 8.4 mg/dL — ABNORMAL LOW (ref 8.9–10.3)
Chloride: 102 mmol/L (ref 98–111)
Creatinine, Ser: 1.51 mg/dL — ABNORMAL HIGH (ref 0.61–1.24)
GFR, Estimated: 43 mL/min — ABNORMAL LOW (ref >60)
Glucose, Bld: 77 mg/dL (ref 70–99)
Potassium: 3.5 mmol/L (ref 3.5–5.1)
Sodium: 139 mmol/L (ref 135–145)

## 2023-01-03 NOTE — Progress Notes (Signed)
   Subjective: 2 Days Post-Op Procedure(s) (LRB): TOTAL HIP ARTHROPLASTY ANTERIOR APPROACH (Right)  Patient seen in rounds for Dr. Charlann Boxer. Patient is resting in bed on exam this morning. No acute events overnight. RN in the room with him. He is pleasantly confused. He appropriately answers what he would like for breakfast. He ambulated 25 feet yesterday.   We will continue therapy today.   Objective: Vital signs in last 24 hours: Temp:  [98 F (36.7 C)-98.7 F (37.1 C)] 98 F (36.7 C) (09/05 0602) Pulse Rate:  [66-87] 82 (09/05 0602) Resp:  [14-16] 14 (09/05 0602) BP: (131-175)/(61-70) 175/66 (09/05 0602) SpO2:  [93 %-97 %] 96 % (09/05 0602)  Intake/Output from previous day:  Intake/Output Summary (Last 24 hours) at 01/03/2023 0730 Last data filed at 01/03/2023 0600 Gross per 24 hour  Intake 448.85 ml  Output 300 ml  Net 148.85 ml     Intake/Output this shift: No intake/output data recorded.  Labs: Recent Labs    01/02/23 0717 01/03/23 0336  HGB 11.0* 11.4*   Recent Labs    01/02/23 0717 01/03/23 0336  WBC 10.9* 8.7  RBC 3.52* 3.66*  HCT 32.2* 34.5*  PLT 221 212   Recent Labs    01/02/23 0717  NA 136  K 2.9*  CL 102  CO2 26  BUN 16  CREATININE 1.61*  GLUCOSE 107*  CALCIUM 8.1*   No results for input(s): "LABPT", "INR" in the last 72 hours.  Exam: General - Patient is Alert and Confused Extremity - Neurologically intact Sensation intact distally Intact pulses distally Dorsiflexion/Plantar flexion intact Dressing - dressing C/D/I Motor Function - intact, moving foot and toes well on exam.   Past Medical History:  Diagnosis Date   Arthritis    Chronic kidney disease    COPD (chronic obstructive pulmonary disease) (HCC)    Diabetes mellitus    Dyspnea    Gastric ulcer    GERD (gastroesophageal reflux disease)    Hyperlipidemia    Hypertension    Irritable bowel syndrome    Skin cancer     Assessment/Plan: 2 Days Post-Op Procedure(s)  (LRB): TOTAL HIP ARTHROPLASTY ANTERIOR APPROACH (Right) Principal Problem:   S/P total right hip arthroplasty  Estimated body mass index is 21.84 kg/m as calculated from the following:   Height as of this encounter: 6' (1.829 m).   Weight as of this encounter: 73 kg. Advance diet Up with therapy  DVT Prophylaxis - Aspirin Weight bearing as tolerated.  Hgb stable at 11.4 this AM. Will recheck BMP today   Continue working with PT. Will d/c home when meeting goals.   Rosalene Billings, PA-C Orthopedic Surgery [phone] 01/03/2023, 7:30 AM

## 2023-01-03 NOTE — Progress Notes (Signed)
Hypoglycemic Event  CBG: 52  Treatment: 8 oz juice/soda crackers  Symptoms: None  Follow-up CBG: Time: 1240 CBG Result: 80  Possible Reasons for Event:  Inadequate meal intake Comments/MD notified: will  notify MD    Marni Griffon Ione

## 2023-01-03 NOTE — Progress Notes (Signed)
Physical Therapy Treatment Patient Details Name: RANDYL ARRANT MRN: [id number] DOB: 08-25-1928 Today's Date: 01/03/2023   History of Present Illness 89 y.o. male admitted 01/01/23 for R THA-AA. PMH: DM, HTN, CKD, IBS    PT Comments  Pt tolerated increased ambulation distance of 30' with RW, distance limited by pain and fatigue. Pt performed R THA exercises with min assist. Pt is pleasantly confused, oriented to self only but is able to follow 1 step commands and puts forth good effort.     If plan is discharge home, recommend the following: A lot of help with walking and/or transfers;A lot of help with bathing/dressing/bathroom;Assistance with cooking/housework;Assist for transportation;Help with stairs or ramp for entrance;Direct supervision/assist for medications management;Direct supervision/assist for financial management   Can travel by private vehicle        Equipment Recommendations  Rolling walker (2 wheels)    Recommendations for Other Services       Precautions / Restrictions Precautions Precautions: Fall Precaution Comments: pt reports he's had falls at home Restrictions Weight Bearing Restrictions: No     Mobility  Bed Mobility Overal bed mobility: Needs Assistance Bed Mobility: Supine to Sit       Sit to supine: Mod assist   General bed mobility comments: assist to raise trunk, pivot hips to EOB and advance BLEs; used gait belt as a RLE lifter    Transfers Overall transfer level: Needs assistance Equipment used: Rolling walker (2 wheels) Transfers: Sit to/from Stand Sit to Stand: Mod assist, From elevated surface           General transfer comment: assist to power up, VCs hand placement    Ambulation/Gait Ambulation/Gait assistance: Contact guard assist Gait Distance (Feet): 45 Feet Assistive device: Rolling walker (2 wheels) Gait Pattern/deviations: Step-to pattern, Decreased step length - right, Decreased step length - left, Trunk flexed Gait  velocity: decr     General Gait Details: distance limited by pain/fatigue, VCs for positioning in RW and for posture   Stairs             Wheelchair Mobility     Tilt Bed    Modified Rankin (Stroke Patients Only)       Balance Overall balance assessment: Needs assistance Sitting-balance support: Feet supported, Single extremity supported Sitting balance-Leahy Scale: Fair     Standing balance support: Bilateral upper extremity supported, During functional activity, Reliant on assistive device for balance Standing balance-Leahy Scale: Poor                              Cognition Arousal: Alert Behavior During Therapy: WFL for tasks assessed/performed Overall Cognitive Status: No family/caregiver present to determine baseline cognitive functioning                                 General Comments: pt oriented to self only, can follow commands, is pleasant        Exercises Total Joint Exercises Ankle Circles/Pumps: AROM, Both, 10 reps, Supine Short Arc Quad: AROM, Right, 10 reps, Supine Heel Slides: AAROM, Right, Supine, 10 reps Hip ABduction/ADduction: AAROM, Right, Supine, 10 reps    General Comments        Pertinent Vitals/Pain Pain Assessment Faces Pain Scale: Hurts little more Pain Location: R hip with movement Pain Descriptors / Indicators: Grimacing, Operative site guarding Pain Intervention(s): Limited activity within patient's tolerance, Monitored during session, Repositioned,  Premedicated before session    Home Living                          Prior Function            PT Goals (current goals can now be found in the care plan section) Acute Rehab PT Goals PT Goal Formulation: Patient unable to participate in goal setting Time For Goal Achievement: 01/16/23 Potential to Achieve Goals: Good Progress towards PT goals: Progressing toward goals    Frequency    7X/week      PT Plan       Co-evaluation              AM-PAC PT "6 Clicks" Mobility   Outcome Measure  Help needed turning from your back to your side while in a flat bed without using bedrails?: A Lot Help needed moving from lying on your back to sitting on the side of a flat bed without using bedrails?: A Lot Help needed moving to and from a bed to a chair (including a wheelchair)?: A Lot Help needed standing up from a chair using your arms (e.g., wheelchair or bedside chair)?: A Lot Help needed to walk in hospital room?: A Little Help needed climbing 3-5 steps with a railing? : Total 6 Click Score: 12    End of Session Equipment Utilized During Treatment: Gait belt Activity Tolerance: Patient tolerated treatment well Patient left: in chair;with call bell/phone within reach;with chair alarm set Nurse Communication: Mobility status PT Visit Diagnosis: Difficulty in walking, not elsewhere classified (R26.2);Muscle weakness (generalized) (M62.81);Other abnormalities of gait and mobility (R26.89);Unsteadiness on feet (R26.81);History of falling (Z91.81);Pain Pain - Right/Left: Right Pain - part of body: Hip     Time: 4696-2952 PT Time Calculation (min) (ACUTE ONLY): 19 min  Charges:    $Gait Training: 8-22 mins PT General Charges $$ ACUTE PT VISIT: 1 Visit                     Tamala Ser PT 01/03/2023  Acute Rehabilitation Services  Office [phone]

## 2023-01-03 NOTE — Inpatient Diabetes Management (Signed)
Inpatient Diabetes Program Recommendations  AACE/ADA: New Consensus Statement on Inpatient Glycemic Control (2015)  Target Ranges:  Prepandial:   less than 140 mg/dL      Peak postprandial:   less than 180 mg/dL (1-2 hours)      Critically ill patients:  140 - 180 mg/dL   Lab Results  Component Value Date   GLUCAP 52 (L) 01/03/2023   HGBA1C 7.3 (H) 12/19/2022    Review   Latest Reference Range & Units 01/03/23 07:46 01/03/23 12:07  Glucose-Capillary 70 - 99 mg/dL 76 52 (L)  (L): Data is abnormally low of Glycemic Control  Diabetes history: DM2 Outpatient Diabetes medications: Glipizide 5 mg BID Current orders for Inpatient glycemic control: Novolog 0-15 units TID, Glipizide 5 mg QAM  Inpatient Diabetes Program Recommendations:    Hypoglycemia of 52 mg/dL at 40:98 today.  Please consider discontinuing Glipizide while in the hospital.  Will continue to follow while inpatient.  Thank you, Dulce Sellar, MSN, CDCES Diabetes Coordinator Inpatient Diabetes Program [phone] (team pager from 8a-5p)

## 2023-01-03 NOTE — Progress Notes (Signed)
Physical Therapy Treatment Patient Details Name: Gordon Soto MRN: [id number] DOB: 14-Aug-1928 Today's Date: 01/03/2023   History of Present Illness 89 y.o. male admitted 01/01/23 for R THA-AA. PMH: DM, HTN, CKD, IBS    PT Comments  Pt ambulated 21' with RW, no loss of balance, distance limited by pain & fatigue. Pt soiled with BM at start of session, assisted with cleanup. Pt performed R hip ROM exercises with assistance.     If plan is discharge home, recommend the following: A lot of help with walking and/or transfers;A lot of help with bathing/dressing/bathroom;Assistance with cooking/housework;Assist for transportation;Help with stairs or ramp for entrance;Direct supervision/assist for medications management;Direct supervision/assist for financial management   Can travel by private vehicle        Equipment Recommendations  Rolling walker (2 wheels)    Recommendations for Other Services       Precautions / Restrictions Precautions Precautions: Fall Precaution Comments: pt reports he's had falls at home Restrictions Weight Bearing Restrictions: No     Mobility  Bed Mobility Overal bed mobility: Needs Assistance Bed Mobility: Supine to Sit       Sit to supine: Mod assist   General bed mobility comments: assist to raise trunk, pivot hips to EOB and advance BLEs    Transfers Overall transfer level: Needs assistance Equipment used: Rolling walker (2 wheels) Transfers: Sit to/from Stand Sit to Stand: Mod assist, From elevated surface           General transfer comment: assist to power up, VCs hand placement    Ambulation/Gait Ambulation/Gait assistance: Contact guard assist Gait Distance (Feet): 13 Feet Assistive device: Rolling walker (2 wheels) Gait Pattern/deviations: Step-to pattern, Decreased step length - right, Decreased step length - left, Trunk flexed Gait velocity: decr     General Gait Details: distance limited by pain/fatigue, VCs for positioning  in RW and for posture   Stairs             Wheelchair Mobility     Tilt Bed    Modified Rankin (Stroke Patients Only)       Balance Overall balance assessment: Needs assistance Sitting-balance support: Feet supported, Single extremity supported Sitting balance-Leahy Scale: Fair     Standing balance support: Bilateral upper extremity supported, During functional activity, Reliant on assistive device for balance Standing balance-Leahy Scale: Poor                              Cognition Arousal: Alert Behavior During Therapy: WFL for tasks assessed/performed Overall Cognitive Status: No family/caregiver present to determine baseline cognitive functioning                                 General Comments: pt oriented to self only        Exercises Total Joint Exercises Ankle Circles/Pumps: AROM, Both, 10 reps, Supine Heel Slides: AAROM, Right, 5 reps, Supine Hip ABduction/ADduction: AAROM, Right, 5 reps, Supine    General Comments        Pertinent Vitals/Pain Pain Assessment Faces Pain Scale: Hurts even more Pain Location: R hip with movement Pain Descriptors / Indicators: Grimacing, Operative site guarding Pain Intervention(s): Limited activity within patient's tolerance, Monitored during session, Premedicated before session, Repositioned    Home Living  Prior Function            PT Goals (current goals can now be found in the care plan section) Acute Rehab PT Goals PT Goal Formulation: Patient unable to participate in goal setting Time For Goal Achievement: 01/16/23 Potential to Achieve Goals: Good Progress towards PT goals: Progressing toward goals    Frequency    7X/week      PT Plan      Co-evaluation              AM-PAC PT "6 Clicks" Mobility   Outcome Measure  Help needed turning from your back to your side while in a flat bed without using bedrails?: A Lot Help  needed moving from lying on your back to sitting on the side of a flat bed without using bedrails?: A Lot Help needed moving to and from a bed to a chair (including a wheelchair)?: A Lot Help needed standing up from a chair using your arms (e.g., wheelchair or bedside chair)?: A Lot Help needed to walk in hospital room?: A Little Help needed climbing 3-5 steps with a railing? : Total 6 Click Score: 12    End of Session Equipment Utilized During Treatment: Gait belt Activity Tolerance: Patient tolerated treatment well Patient left: in chair;with call bell/phone within reach;with chair alarm set Nurse Communication: Mobility status PT Visit Diagnosis: Difficulty in walking, not elsewhere classified (R26.2);Muscle weakness (generalized) (M62.81);Other abnormalities of gait and mobility (R26.89);Unsteadiness on feet (R26.81);History of falling (Z91.81);Pain Pain - Right/Left: Right Pain - part of body: Hip     Time: 9562-1308 PT Time Calculation (min) (ACUTE ONLY): 16 min  Charges:    $Gait Training: 8-22 mins PT General Charges $$ ACUTE PT VISIT: 1 Visit                     Tamala Ser PT 01/03/2023  Acute Rehabilitation Services  Office [phone]

## 2023-01-04 LAB — GLUCOSE, CAPILLARY
Glucose-Capillary: 82 mg/dL (ref 70–99)
Glucose-Capillary: 140 mg/dL — ABNORMAL HIGH (ref 70–99)

## 2023-01-04 MED ORDER — POLYETHYLENE GLYCOL 3350 17 G PO PACK: 17.0000 g | PACK | Freq: Two times a day (BID) | ORAL | 0 refills | Status: AC

## 2023-01-04 MED ORDER — METHOCARBAMOL 500 MG PO TABS: 500.0000 mg | ORAL_TABLET | Freq: Three times a day (TID) | ORAL | 2 refills | Status: AC | PRN

## 2023-01-04 MED ORDER — ASPIRIN 81 MG PO CHEW: 81.0000 mg | CHEWABLE_TABLET | Freq: Two times a day (BID) | ORAL | 0 refills | Status: AC

## 2023-01-04 NOTE — Plan of Care (Signed)
  Problem: Skin Integrity: Goal: Risk for impaired skin integrity will decrease Outcome: Progressing   Problem: Tissue Perfusion: Goal: Adequacy of tissue perfusion will improve Outcome: Progressing   Problem: Activity: Goal: Ability to avoid complications of mobility impairment will improve Outcome: Progressing   Problem: Pain Management: Goal: Pain level will decrease with appropriate interventions Outcome: Progressing

## 2023-01-04 NOTE — Progress Notes (Signed)
Physical Therapy Treatment Patient Details Name: Gordon Soto MRN: [id number] DOB: 06/09/1928 Today's Date: 01/04/2023   History of Present Illness 89 y.o. male admitted 01/01/23 for R THA-AA. PMH: DM, HTN, CKD, IBS    PT Comments  Pt ambulated to/from the bathroom, 12' + 14' with RW, distance limited by pain and fatigue. Reviewed HEP, handout issued. He is ready to DC home with 24 hour assistance from a PT standpoint.     If plan is discharge home, recommend the following: A lot of help with walking and/or transfers;A lot of help with bathing/dressing/bathroom;Assistance with cooking/housework;Assist for transportation;Help with stairs or ramp for entrance;Direct supervision/assist for medications management;Direct supervision/assist for financial management   Can travel by private vehicle        Equipment Recommendations  Rolling walker (2 wheels)    Recommendations for Other Services       Precautions / Restrictions Precautions Precautions: Fall Precaution Comments: pt reports he's had falls at home Restrictions Weight Bearing Restrictions: No     Mobility  Bed Mobility Overal bed mobility: Needs Assistance Bed Mobility: Supine to Sit     Supine to sit: Mod assist     General bed mobility comments: assist to raise trunk, pivot hips to EOB and advance BLEs; used gait belt as a RLE lifter    Transfers Overall transfer level: Needs assistance Equipment used: Rolling walker (2 wheels) Transfers: Sit to/from Stand Sit to Stand: From elevated surface, Min assist, Mod assist           General transfer comment: assist to power up, VCs hand placement; min A from elevated bed, mod A from low commode (no BSC in room)    Ambulation/Gait Ambulation/Gait assistance: Contact guard assist Gait Distance (Feet): 26 Feet Assistive device: Rolling walker (2 wheels) Gait Pattern/deviations: Step-to pattern, Decreased step length - right, Decreased step length - left, Trunk  flexed Gait velocity: decr     General Gait Details: 38' + 14', distance limited by pain/fatigue, VCs for positioning in RW and for posture   Stairs             Wheelchair Mobility     Tilt Bed    Modified Rankin (Stroke Patients Only)       Balance Overall balance assessment: Needs assistance Sitting-balance support: Feet supported, Single extremity supported Sitting balance-Leahy Scale: Fair     Standing balance support: Bilateral upper extremity supported, During functional activity, Reliant on assistive device for balance Standing balance-Leahy Scale: Poor                              Cognition Arousal: Alert Behavior During Therapy: WFL for tasks assessed/performed Overall Cognitive Status: No family/caregiver present to determine baseline cognitive functioning                                 General Comments: pt oriented to self only, can follow commands, is pleasant        Exercises Total Joint Exercises Ankle Circles/Pumps: AROM, Both, 10 reps, Supine Short Arc Quad: AROM, Right, 10 reps, Supine Heel Slides: AAROM, Right, Supine, 10 reps Hip ABduction/ADduction: AAROM, Right, Supine, 10 reps Long Arc Quad: AROM, Right, Seated, 10 reps    General Comments        Pertinent Vitals/Pain Pain Assessment Faces Pain Scale: Hurts even more Pain Location: R hip with movement Pain Descriptors /  Indicators: Grimacing, Operative site guarding Pain Intervention(s): Limited activity within patient's tolerance, Monitored during session, Premedicated before session, Ice applied, Repositioned    Home Living                          Prior Function            PT Goals (current goals can now be found in the care plan section) Acute Rehab PT Goals PT Goal Formulation: Patient unable to participate in goal setting Time For Goal Achievement: 01/16/23 Potential to Achieve Goals: Good Progress towards PT goals: Progressing  toward goals    Frequency    7X/week      PT Plan      Co-evaluation              AM-PAC PT "6 Clicks" Mobility   Outcome Measure  Help needed turning from your back to your side while in a flat bed without using bedrails?: A Little Help needed moving from lying on your back to sitting on the side of a flat bed without using bedrails?: A Lot Help needed moving to and from a bed to a chair (including a wheelchair)?: A Little Help needed standing up from a chair using your arms (e.g., wheelchair or bedside chair)?: A Lot Help needed to walk in hospital room?: A Little Help needed climbing 3-5 steps with a railing? : A Lot 6 Click Score: 15    End of Session Equipment Utilized During Treatment: Gait belt Activity Tolerance: Patient tolerated treatment well Patient left: in chair;with call bell/phone within reach;with chair alarm set Nurse Communication: Mobility status PT Visit Diagnosis: Difficulty in walking, not elsewhere classified (R26.2);Muscle weakness (generalized) (M62.81);Other abnormalities of gait and mobility (R26.89);Unsteadiness on feet (R26.81);History of falling (Z91.81);Pain Pain - Right/Left: Right Pain - part of body: Hip     Time: 6295-2841 PT Time Calculation (min) (ACUTE ONLY): 29 min  Charges:    $Gait Training: 8-22 mins $Therapeutic Exercise: 8-22 mins PT General Charges $$ ACUTE PT VISIT: 1 Visit                     Ralene Bathe Kistler PT 01/04/2023  Acute Rehabilitation Services  Office [phone]

## 2023-01-04 NOTE — Progress Notes (Signed)
Patient ID: Gordon Soto, male   DOB: Jan 13, 1929, 89 y.o.   MRN: [id number] Subjective: 3 Days Post-Op Procedure(s) (LRB): TOTAL HIP ARTHROPLASTY ANTERIOR APPROACH (Right)    Patient remains confused related to pre-operative dementia. Issues surrounding his blood sugars are stable will glipizide held   Objective:   VITALS:   Vitals:   01/03/23 2236 01/04/23 0625  BP: (!) 148/78 (!) 157/71  Pulse: 69 71  Resp:  16  Temp:  98.5 F (36.9 C)  SpO2:  96%    Neurovascular intact Incision: dressing C/D/I - right hip  LABS Recent Labs    01/02/23 0717 01/03/23 0336  HGB 11.0* 11.4*  HCT 32.2* 34.5*  WBC 10.9* 8.7  PLT 221 212    Recent Labs    01/02/23 0717 01/03/23 0755  NA 136 139  K 2.9* 3.5  BUN 16 18  CREATININE 1.61* 1.51*  GLUCOSE 107* 77    No results for input(s): "LABPT", "INR" in the last 72 hours.   Assessment/Plan: 3 Days Post-Op Procedure(s) (LRB): TOTAL HIP ARTHROPLASTY ANTERIOR APPROACH (Right)   Up with therapy Work towards home discharge today RTC in 2 weeks ASA for DVT prophylaxis Family has been instructed to be as helpful as possible through his recovery from medical and post arthroplasty standpoint

## 2023-01-14 NOTE — Discharge Summary (Addendum)
Patient ID: Gordon Soto MRN: [id number] DOB/AGE: 1928/12/12 89 y.o.  Admit date: 01/01/2023 Discharge date: 01/04/2023  Admission Diagnoses:  Right hip osteoarthritis  Discharge Diagnoses:  Principal Problem:   S/P total right hip arthroplasty   Past Medical History:  Diagnosis Date   Arthritis    Chronic kidney disease, Stage 3B    COPD (chronic obstructive pulmonary disease) (HCC)    Diabetes mellitus    Dyspnea    Gastric ulcer    GERD (gastroesophageal reflux disease)    Hyperlipidemia    Hypertension    Irritable bowel syndrome    Skin cancer     Surgeries: Procedure(s): TOTAL HIP ARTHROPLASTY ANTERIOR APPROACH on 01/01/2023   Consultants:   Discharged Condition: Improved  Hospital Course: JAVIN PALMS is an 89 y.o. male who was admitted 01/01/2023 for operative treatment ofS/P total right hip arthroplasty. Patient has severe unremitting pain that affects sleep, daily activities, and work/hobbies. After pre-op clearance the patient was taken to the operating room on 01/01/2023 and underwent  Procedure(s): TOTAL HIP ARTHROPLASTY ANTERIOR APPROACH.    Patient was given perioperative antibiotics:  Anti-infectives (From admission, onward)    Start     Dose/Rate Route Frequency Ordered Stop   01/01/23 2000  ceFAZolin (ANCEF) IVPB 2g/100 mL premix        2 g 200 mL/hr over 30 Minutes Intravenous Every 6 hours 01/01/23 1819 01/02/23 0204   01/01/23 1200  ceFAZolin (ANCEF) IVPB 2g/100 mL premix        2 g 200 mL/hr over 30 Minutes Intravenous On call to O.R. 01/01/23 1153 01/01/23 1342        Patient was given sequential compression devices, early ambulation, and chemoprophylaxis to prevent DVT. Patient worked with PT and was meeting their goals regarding safe ambulation and transfers.  Patient benefited maximally from hospital stay and there were no complications.    Recent vital signs: No data found.   Recent laboratory studies: No results for input(s): "WBC",  "HGB", "HCT", "PLT", "NA", "K", "CL", "CO2", "BUN", "CREATININE", "GLUCOSE", "INR", "CALCIUM" in the last 72 hours.  Invalid input(s): "PT", "2"   Discharge Medications:   Allergies as of 01/04/2023       Reactions   Paxil [paroxetine Hcl] Other (See Comments)   weak        Medication List     STOP taking these medications    cephALEXin 500 MG capsule Commonly known as: KEFLEX   naproxen sodium 220 MG tablet Commonly known as: ALEVE       TAKE these medications    acetaminophen 500 MG tablet Commonly known as: TYLENOL Take 500-1,000 mg by mouth every 6 (six) hours as needed (pain.).   aspirin 81 MG chewable tablet Chew 1 tablet (81 mg total) by mouth 2 (two) times daily for 28 days.   donepezil 10 MG tablet Commonly known as: ARICEPT Take 10 mg by mouth in the morning.   finasteride 5 MG tablet Commonly known as: PROSCAR Take 5 mg by mouth at bedtime.   fluticasone 50 MCG/ACT nasal spray Commonly known as: FLONASE Place 1 spray into both nostrils daily as needed for allergies.   freestyle lancets 1 Units by Misc.(Non-Drug; Combo Route) route daily. Dx E11.42   glipiZIDE 5 MG 24 hr tablet Commonly known as: GLUCOTROL XL Take 5 mg by mouth daily with breakfast. What changed: Another medication with the same name was removed. Continue taking this medication, and follow the directions you see here.  LORazepam 0.5 MG tablet Commonly known as: ATIVAN Take 0.5 mg by mouth at bedtime as needed for sleep.   Lubricant Eye Drops 0.4-0.3 % Soln Generic drug: Polyethyl Glycol-Propyl Glycol Place 1-2 drops into both eyes 3 (three) times daily as needed (dry/irritated eyes.).   methocarbamol 500 MG tablet Commonly known as: ROBAXIN Take 1 tablet (500 mg total) by mouth every 8 (eight) hours as needed for muscle spasms.   metoprolol succinate 50 MG 24 hr tablet Commonly known as: TOPROL-XL Take 50 mg by mouth in the morning. Take with or immediately following a  meal.   pantoprazole 40 MG tablet Commonly known as: PROTONIX Take 40 mg by mouth at bedtime.   polyethylene glycol 17 g packet Commonly known as: MIRALAX / GLYCOLAX Take 17 g by mouth 2 (two) times daily.   albuterol (2.5 MG/3ML) 0.083% nebulizer solution Commonly known as: PROVENTIL Take 2.5 mg by nebulization every 6 (six) hours as needed for wheezing or shortness of breath.   ProAir HFA 108 (90 Base) MCG/ACT inhaler Generic drug: albuterol Inhale 1-2 puffs into the lungs every 6 (six) hours as needed for wheezing or shortness of breath.   silodosin 8 MG Caps capsule Commonly known as: RAPAFLO Take 8 mg by mouth at bedtime.   simvastatin 20 MG tablet Commonly known as: ZOCOR Take 20 mg by mouth every evening.   traMADol 50 MG tablet Commonly known as: ULTRAM Take 50 mg by mouth 3 (three) times daily as needed (back pain.).               Discharge Care Instructions  (From admission, onward)           Start     Ordered   01/04/23 0000  Change dressing       Comments: Maintain surgical dressing until follow up in the clinic. If the edges start to pull up, may reinforce with tape. If the dressing is no longer working, may remove and cover with gauze and tape, but must keep the area dry and clean.  Call with any questions or concerns.   01/04/23 0818            Diagnostic Studies: DG Pelvis Portable  Result Date: 01/01/2023 CLINICAL DATA:  Status post hip arthroplasty. EXAM: PORTABLE PELVIS 1-2 VIEWS COMPARISON:  None Available. FINDINGS: Right hip arthroplasty in expected alignment. No periprosthetic lucency or fracture. Recent postsurgical change includes air and edema in the soft tissues. IMPRESSION: Right hip arthroplasty without immediate postoperative complication. Electronically Signed   By: Narda Rutherford M.D.   On: 01/01/2023 16:10   DG HIP UNILAT WITH PELVIS 1V RIGHT  Result Date: 01/01/2023 CLINICAL DATA:  Elective surgery. EXAM: DG HIP (WITH OR  WITHOUT PELVIS) 1V RIGHT COMPARISON:  None Available. FINDINGS: Two fluoroscopic spot views of the pelvis and right hip obtained in the operating room. Images during hip arthroplasty. Fluoroscopy time 13 seconds. Dose 1.3532 mGy. IMPRESSION: Intraoperative fluoroscopy for right hip arthroplasty. Electronically Signed   By: Narda Rutherford M.D.   On: 01/01/2023 15:24   DG C-Arm 1-60 Min-No Report  Result Date: 01/01/2023 Fluoroscopy was utilized by the requesting physician.  No radiographic interpretation.    Disposition: Discharge disposition: 01-Home or Self Care       Discharge Instructions     Call MD / Call 911   Complete by: As directed    If you experience chest pain or shortness of breath, CALL 911 and be transported to the hospital emergency  room.  If you develope a fever above 101 F, pus (white drainage) or increased drainage or redness at the wound, or calf pain, call your surgeon's office.   Change dressing   Complete by: As directed    Maintain surgical dressing until follow up in the clinic. If the edges start to pull up, may reinforce with tape. If the dressing is no longer working, may remove and cover with gauze and tape, but must keep the area dry and clean.  Call with any questions or concerns.   Constipation Prevention   Complete by: As directed    Drink plenty of fluids.  Prune juice may be helpful.  You may use a stool softener, such as Colace (over the counter) 100 mg twice a day.  Use MiraLax (over the counter) for constipation as needed.   Diet - low sodium heart healthy   Complete by: As directed    Increase activity slowly as tolerated   Complete by: As directed    Weight bearing as tolerated with assist device (walker, cane, etc) as directed, use it as long as suggested by your surgeon or therapist, typically at least 4-6 weeks.   Post-operative opioid taper instructions:   Complete by: As directed    POST-OPERATIVE OPIOID TAPER INSTRUCTIONS: It is important  to wean off of your opioid medication as soon as possible. If you do not need pain medication after your surgery it is ok to stop day one. Opioids include: Codeine, Hydrocodone(Norco, Vicodin), Oxycodone(Percocet, oxycontin) and hydromorphone amongst others.  Long term and even short term use of opiods can cause: Increased pain response Dependence Constipation Depression Respiratory depression And more.  Withdrawal symptoms can include Flu like symptoms Nausea, vomiting And more Techniques to manage these symptoms Hydrate well Eat regular healthy meals Stay active Use relaxation techniques(deep breathing, meditating, yoga) Do Not substitute Alcohol to help with tapering If you have been on opioids for less than two weeks and do not have pain than it is ok to stop all together.  Plan to wean off of opioids This plan should start within one week post op of your joint replacement. Maintain the same interval or time between taking each dose and first decrease the dose.  Cut the total daily intake of opioids by one tablet each day Next start to increase the time between doses. The last dose that should be eliminated is the evening dose.      TED hose   Complete by: As directed    Use stockings (TED hose) for 2 weeks on both leg(s).  You may remove them at night for sleeping.        Follow-up Information     Durene Romans, MD. Schedule an appointment as soon as possible for a visit in 2 week(s).   Specialty: Orthopedic Surgery Contact information: [address] [place] [address] [phone]         [facility] Follow up.   Specialty: Home Health Services Why: to provide home physical therapy visits Contact information: [address] [place] [address] [phone]                  Signed: Cassandria Anger 01/14/2023, 2:57 PM

## 2023-01-23 ENCOUNTER — Encounter: Payer: Self-pay | Admitting: Podiatry

## 2023-01-23 ENCOUNTER — Ambulatory Visit: Payer: PPO | Admitting: Podiatry

## 2023-01-23 VITALS — BP 147/59 | HR 77

## 2023-01-23 DIAGNOSIS — M7751 Other enthesopathy of right foot: Secondary | ICD-10-CM

## 2023-01-23 DIAGNOSIS — M79674 Pain in right toe(s): Secondary | ICD-10-CM

## 2023-01-23 DIAGNOSIS — M79671 Pain in right foot: Secondary | ICD-10-CM

## 2023-01-23 DIAGNOSIS — M79675 Pain in left toe(s): Secondary | ICD-10-CM

## 2023-01-23 DIAGNOSIS — B351 Tinea unguium: Secondary | ICD-10-CM

## 2023-01-23 MED ORDER — TRIAMCINOLONE ACETONIDE 10 MG/ML IJ SUSP
10.0000 mg | Freq: Once | INTRAMUSCULAR | Status: AC
Start: 2023-01-23 — End: 2023-01-23
  Administered 2023-01-23: 10 mg via INTRA_ARTICULAR

## 2023-01-23 NOTE — Progress Notes (Signed)
Subjective:   Patient ID: Gordon Soto, male   DOB: 89 y.o.   MRN: [id number]   HPI Patient presents with pain in the right sinus tarsi fluid buildup within the joint   ROS      Objective:  Physical Exam  Chronic capsulitis sinus tarsi right with pain     Assessment:  Pain to palpation sinus tarsi right     Plan:  Reviewed with him and caregiver the condition and sterile prep injected the capsule right 3 mg Kenalog 5 mg Xylocaine advised on anti-inflammatories reappoint as needed

## 2023-02-21 ENCOUNTER — Ambulatory Visit: Payer: PPO | Admitting: Podiatry

## 2023-02-21 ENCOUNTER — Encounter: Payer: Self-pay | Admitting: Podiatry

## 2023-02-21 DIAGNOSIS — M7751 Other enthesopathy of right foot: Secondary | ICD-10-CM

## 2023-02-21 MED ORDER — TRIAMCINOLONE ACETONIDE 10 MG/ML IJ SUSP
10.0000 mg | Freq: Once | INTRAMUSCULAR | Status: AC
Start: 2023-02-21 — End: 2023-02-21
  Administered 2023-02-21: 10 mg via INTRA_ARTICULAR

## 2023-02-22 NOTE — Progress Notes (Signed)
Subjective:   Patient ID: Gordon Soto, male   DOB: 89 y.o.   MRN: [id number]   HPI Patient presents stating having a lot of pain in his right ankle and it seems to be in a different place than we had worked on and he is looking for any kind of relief    ROS      Objective:  Physical Exam  Neurovascular status intact with the patient found to have inflammation pain of the right ankle with fluid that is difficult to work on seems to be more medial than it was     Assessment:  Difficult problem given advanced age with inflammatory capsulitis of the ankle     Plan:  H&P done and today I went from the medial side injected the ankle 3 mg dexamethasone Kenalog 5 mg Xylocaine advised on reduced activity reappoint as symptoms indicate

## 2023-04-03 ENCOUNTER — Encounter: Payer: Self-pay | Admitting: Podiatry

## 2023-04-03 ENCOUNTER — Ambulatory Visit: Payer: PPO | Admitting: Podiatry

## 2023-04-03 DIAGNOSIS — M7751 Other enthesopathy of right foot: Secondary | ICD-10-CM | POA: Diagnosis not present

## 2023-04-03 MED ORDER — TRIAMCINOLONE ACETONIDE 10 MG/ML IJ SUSP
10.0000 mg | Freq: Once | INTRAMUSCULAR | Status: AC
  Administered 2023-04-03: 10 mg via INTRA_ARTICULAR

## 2023-04-07 NOTE — Progress Notes (Signed)
Subjective:   Patient ID: Gordon Soto, male   DOB: 89 y.o.   MRN: [id number]   HPI Patient presents stating he is still getting a lot of symptoms he is desperate for any form of relief   ROS      Objective:  Physical Exam  Neurovascular status unchanged with patient found to have discomfort into the right sinus tarsi and the ankle joint with very difficult condition due to advanced age     Assessment:  Inflammatory capsulitis with pain that we are not at this point getting under control and we used to be able to get much better control     Plan:  Reviewed with him and caregiver the difficulty of this they understand completely and went ahead today and I did inject the right ankle from a different angle to try to reduce the inflammatory complex and hopefully get him a longer period of relief.  Do not see any other current treatments that would be of benefit
